# Patient Record
Sex: Female | Born: 1955
Health system: Southern US, Community
[De-identification: ages and names within clinical notes are randomized; demographics above are authoritative.]

## PROBLEM LIST (undated history)

## (undated) DIAGNOSIS — N200 Calculus of kidney: Secondary | ICD-10-CM

## (undated) DIAGNOSIS — H269 Unspecified cataract: Secondary | ICD-10-CM

## (undated) DIAGNOSIS — G4730 Sleep apnea, unspecified: Secondary | ICD-10-CM

## (undated) DIAGNOSIS — M1990 Unspecified osteoarthritis, unspecified site: Secondary | ICD-10-CM

## (undated) DIAGNOSIS — T7840XA Allergy, unspecified, initial encounter: Secondary | ICD-10-CM

## (undated) DIAGNOSIS — G4700 Insomnia, unspecified: Secondary | ICD-10-CM

## (undated) DIAGNOSIS — I8393 Asymptomatic varicose veins of bilateral lower extremities: Secondary | ICD-10-CM

## (undated) DIAGNOSIS — K219 Gastro-esophageal reflux disease without esophagitis: Secondary | ICD-10-CM

## (undated) HISTORY — DX: Unspecified cataract: H26.9

## (undated) HISTORY — PX: TONSILLECTOMY: SUR1361

## (undated) HISTORY — DX: Asymptomatic varicose veins of bilateral lower extremities: I83.93

## (undated) HISTORY — PX: CATARACT EXTRACTION: SUR2

## (undated) HISTORY — DX: Unspecified osteoarthritis, unspecified site: M19.90

## (undated) HISTORY — DX: Insomnia, unspecified: G47.00

## (undated) HISTORY — DX: Allergy, unspecified, initial encounter: T78.40XA

## (undated) HISTORY — DX: Gastro-esophageal reflux disease without esophagitis: K21.9

## (undated) HISTORY — PX: OTHER SURGICAL HISTORY: SHX169

## (undated) HISTORY — PX: POLYPECTOMY: SHX149

## (undated) HISTORY — DX: Sleep apnea, unspecified: G47.30

## (undated) HISTORY — DX: Calculus of kidney: N20.0

## (undated) HISTORY — PX: COLONOSCOPY: SHX174

## (undated) HISTORY — PX: KIDNEY STONE SURGERY: SHX686

---

## 2000-03-08 ENCOUNTER — Ambulatory Visit (HOSPITAL_COMMUNITY): Admission: RE | Admit: 2000-03-08 | Discharge: 2000-03-08 | Payer: Self-pay | Admitting: *Deleted

## 2000-03-08 ENCOUNTER — Encounter: Payer: Self-pay | Admitting: *Deleted

## 2000-08-07 ENCOUNTER — Other Ambulatory Visit: Admission: RE | Admit: 2000-08-07 | Discharge: 2000-08-07 | Payer: Self-pay | Admitting: Family Medicine

## 2001-11-18 ENCOUNTER — Other Ambulatory Visit: Admission: RE | Admit: 2001-11-18 | Discharge: 2001-12-07 | Payer: Self-pay

## 2003-09-14 ENCOUNTER — Other Ambulatory Visit: Admission: RE | Admit: 2003-09-14 | Discharge: 2003-09-14 | Payer: Self-pay | Admitting: Family Medicine

## 2004-04-22 ENCOUNTER — Ambulatory Visit: Payer: Self-pay | Admitting: Internal Medicine

## 2004-05-17 ENCOUNTER — Ambulatory Visit: Payer: Self-pay | Admitting: Family Medicine

## 2005-04-10 ENCOUNTER — Other Ambulatory Visit: Admission: RE | Admit: 2005-04-10 | Discharge: 2005-04-10 | Payer: Self-pay | Admitting: Family Medicine

## 2005-04-10 ENCOUNTER — Encounter: Payer: Self-pay | Admitting: Family Medicine

## 2005-04-10 ENCOUNTER — Ambulatory Visit: Payer: Self-pay | Admitting: Family Medicine

## 2005-04-19 ENCOUNTER — Ambulatory Visit: Payer: Self-pay | Admitting: Family Medicine

## 2005-09-29 ENCOUNTER — Ambulatory Visit: Payer: Self-pay | Admitting: Cardiology

## 2005-09-29 ENCOUNTER — Ambulatory Visit: Payer: Self-pay | Admitting: Family Medicine

## 2006-02-19 ENCOUNTER — Ambulatory Visit: Payer: Self-pay | Admitting: Family Medicine

## 2006-03-06 ENCOUNTER — Ambulatory Visit: Payer: Self-pay | Admitting: Family Medicine

## 2006-06-20 ENCOUNTER — Ambulatory Visit: Payer: Self-pay | Admitting: Family Medicine

## 2006-06-20 LAB — CONVERTED CEMR LAB
ALT: 18 units/L (ref 0–40)
AST: 21 units/L (ref 0–37)
Albumin: 4.1 g/dL (ref 3.5–5.2)
Alkaline Phosphatase: 52 units/L (ref 39–117)
BUN: 15 mg/dL (ref 6–23)
Basophils Absolute: 0.1 10*3/uL (ref 0.0–0.1)
Basophils Relative: 1 % (ref 0.0–1.0)
Bilirubin, Direct: 0.1 mg/dL (ref 0.0–0.3)
CO2: 31 meq/L (ref 19–32)
Calcium: 9.9 mg/dL (ref 8.4–10.5)
Chloride: 107 meq/L (ref 96–112)
Cholesterol: 221 mg/dL (ref 0–200)
Creatinine, Ser: 0.7 mg/dL (ref 0.4–1.2)
Direct LDL: 130 mg/dL
Eosinophils Absolute: 0.1 10*3/uL (ref 0.0–0.6)
Eosinophils Relative: 1.7 % (ref 0.0–5.0)
GFR calc Af Amer: 115 mL/min
GFR calc non Af Amer: 95 mL/min
Glucose, Bld: 81 mg/dL (ref 70–99)
HCT: 41.8 % (ref 36.0–46.0)
HDL: 66.6 mg/dL (ref >39.0)
Hemoglobin: 14.3 g/dL (ref 12.0–15.0)
Lymphocytes Relative: 34.7 % (ref 12.0–46.0)
MCHC: 34.2 g/dL (ref 30.0–36.0)
MCV: 90.4 fL (ref 78.0–100.0)
Monocytes Absolute: 0.5 10*3/uL (ref 0.2–0.7)
Monocytes Relative: 9.3 % (ref 3.0–11.0)
Neutro Abs: 2.8 10*3/uL (ref 1.4–7.7)
Neutrophils Relative %: 53.3 % (ref 43.0–77.0)
Platelets: 214 10*3/uL (ref 150–400)
Potassium: 4.3 meq/L (ref 3.5–5.1)
RBC: 4.62 M/uL (ref 3.87–5.11)
RDW: 12.3 % (ref 11.5–14.6)
Sodium: 145 meq/L (ref 135–145)
TSH: 0.89 microintl units/mL (ref 0.35–5.50)
Total Bilirubin: 0.7 mg/dL (ref 0.3–1.2)
Total CHOL/HDL Ratio: 3.3
Total Protein: 6.9 g/dL (ref 6.0–8.3)
Triglycerides: 70 mg/dL (ref 0–149)
VLDL: 14 mg/dL (ref 0–40)
WBC: 5.4 10*3/uL (ref 4.5–10.5)

## 2006-06-27 ENCOUNTER — Ambulatory Visit: Payer: Self-pay | Admitting: Family Medicine

## 2006-06-27 ENCOUNTER — Other Ambulatory Visit: Admission: RE | Admit: 2006-06-27 | Discharge: 2006-06-27 | Payer: Self-pay | Admitting: Family Medicine

## 2006-06-27 ENCOUNTER — Encounter: Payer: Self-pay | Admitting: Family Medicine

## 2007-01-28 ENCOUNTER — Ambulatory Visit: Payer: Self-pay | Admitting: Family Medicine

## 2007-01-28 DIAGNOSIS — J309 Allergic rhinitis, unspecified: Secondary | ICD-10-CM | POA: Insufficient documentation

## 2007-01-28 DIAGNOSIS — Z9189 Other specified personal risk factors, not elsewhere classified: Secondary | ICD-10-CM | POA: Insufficient documentation

## 2007-01-28 DIAGNOSIS — Z87410 Personal history of cervical dysplasia: Secondary | ICD-10-CM | POA: Insufficient documentation

## 2007-01-28 DIAGNOSIS — L989 Disorder of the skin and subcutaneous tissue, unspecified: Secondary | ICD-10-CM | POA: Insufficient documentation

## 2007-01-28 HISTORY — DX: Other specified personal risk factors, not elsewhere classified: Z91.89

## 2007-02-18 ENCOUNTER — Telehealth (INDEPENDENT_AMBULATORY_CARE_PROVIDER_SITE_OTHER): Payer: Self-pay | Admitting: *Deleted

## 2007-05-29 ENCOUNTER — Ambulatory Visit: Payer: Self-pay | Admitting: Family Medicine

## 2007-05-29 DIAGNOSIS — R1012 Left upper quadrant pain: Secondary | ICD-10-CM | POA: Insufficient documentation

## 2007-08-20 ENCOUNTER — Ambulatory Visit: Payer: Self-pay | Admitting: Family Medicine

## 2007-08-20 LAB — CONVERTED CEMR LAB
Bilirubin Urine: NEGATIVE
Blood in Urine, dipstick: NEGATIVE
Glucose, Urine, Semiquant: NEGATIVE
Ketones, urine, test strip: NEGATIVE
Nitrite: NEGATIVE
Protein, U semiquant: NEGATIVE
Specific Gravity, Urine: 1.025
Urobilinogen, UA: 0.2
pH: 6

## 2007-08-21 LAB — CONVERTED CEMR LAB
ALT: 19 units/L (ref 0–35)
AST: 22 units/L (ref 0–37)
Albumin: 4.1 g/dL (ref 3.5–5.2)
Alkaline Phosphatase: 60 units/L (ref 39–117)
BUN: 16 mg/dL (ref 6–23)
Basophils Absolute: 0 10*3/uL (ref 0.0–0.1)
Basophils Relative: 0.7 % (ref 0.0–1.0)
Bilirubin, Direct: 0.1 mg/dL (ref 0.0–0.3)
CO2: 32 meq/L (ref 19–32)
Calcium: 9.5 mg/dL (ref 8.4–10.5)
Chloride: 108 meq/L (ref 96–112)
Cholesterol: 233 mg/dL (ref 0–200)
Creatinine, Ser: 0.8 mg/dL (ref 0.4–1.2)
Direct LDL: 150.6 mg/dL
Eosinophils Absolute: 0.1 10*3/uL (ref 0.0–0.7)
Eosinophils Relative: 1.9 % (ref 0.0–5.0)
GFR calc Af Amer: 98 mL/min
GFR calc non Af Amer: 81 mL/min
Glucose, Bld: 102 mg/dL — ABNORMAL HIGH (ref 70–99)
HCT: 40.9 % (ref 36.0–46.0)
HDL: 67.6 mg/dL (ref >39.0)
Hemoglobin: 14 g/dL (ref 12.0–15.0)
Lymphocytes Relative: 35.1 % (ref 12.0–46.0)
MCHC: 34.3 g/dL (ref 30.0–36.0)
MCV: 90.1 fL (ref 78.0–100.0)
Monocytes Absolute: 0.4 10*3/uL (ref 0.1–1.0)
Monocytes Relative: 7.7 % (ref 3.0–12.0)
Neutro Abs: 2.8 10*3/uL (ref 1.4–7.7)
Neutrophils Relative %: 54.6 % (ref 43.0–77.0)
Platelets: 197 10*3/uL (ref 150–400)
Potassium: 4.2 meq/L (ref 3.5–5.1)
RBC: 4.54 M/uL (ref 3.87–5.11)
RDW: 12.1 % (ref 11.5–14.6)
Sodium: 140 meq/L (ref 135–145)
TSH: 0.72 microintl units/mL (ref 0.35–5.50)
Total Bilirubin: 0.6 mg/dL (ref 0.3–1.2)
Total CHOL/HDL Ratio: 3.4
Total Protein: 6.8 g/dL (ref 6.0–8.3)
Triglycerides: 60 mg/dL (ref 0–149)
VLDL: 12 mg/dL (ref 0–40)
WBC: 5.1 10*3/uL (ref 4.5–10.5)

## 2007-08-27 ENCOUNTER — Ambulatory Visit: Payer: Self-pay | Admitting: Family Medicine

## 2007-08-27 ENCOUNTER — Encounter: Payer: Self-pay | Admitting: Family Medicine

## 2007-08-27 ENCOUNTER — Other Ambulatory Visit: Admission: RE | Admit: 2007-08-27 | Discharge: 2007-08-27 | Payer: Self-pay | Admitting: Family Medicine

## 2007-08-27 DIAGNOSIS — K219 Gastro-esophageal reflux disease without esophagitis: Secondary | ICD-10-CM | POA: Insufficient documentation

## 2007-09-10 ENCOUNTER — Encounter: Payer: Self-pay | Admitting: Family Medicine

## 2007-09-24 ENCOUNTER — Ambulatory Visit: Payer: Self-pay | Admitting: Gastroenterology

## 2007-10-07 ENCOUNTER — Encounter: Payer: Self-pay | Admitting: Gastroenterology

## 2007-10-07 ENCOUNTER — Ambulatory Visit: Payer: Self-pay | Admitting: Gastroenterology

## 2007-10-07 ENCOUNTER — Encounter: Payer: Self-pay | Admitting: Family Medicine

## 2007-10-09 ENCOUNTER — Encounter: Payer: Self-pay | Admitting: Gastroenterology

## 2007-10-18 ENCOUNTER — Ambulatory Visit: Payer: Self-pay | Admitting: Family Medicine

## 2007-10-18 DIAGNOSIS — H8110 Benign paroxysmal vertigo, unspecified ear: Secondary | ICD-10-CM | POA: Insufficient documentation

## 2008-09-14 ENCOUNTER — Encounter: Payer: Self-pay | Admitting: Family Medicine

## 2008-12-14 ENCOUNTER — Ambulatory Visit: Payer: Self-pay | Admitting: Family Medicine

## 2008-12-14 DIAGNOSIS — M7020 Olecranon bursitis, unspecified elbow: Secondary | ICD-10-CM | POA: Insufficient documentation

## 2008-12-15 ENCOUNTER — Encounter: Admission: RE | Admit: 2008-12-15 | Discharge: 2008-12-15 | Payer: Self-pay | Admitting: Family Medicine

## 2008-12-22 ENCOUNTER — Ambulatory Visit: Payer: Self-pay | Admitting: Family Medicine

## 2009-09-24 ENCOUNTER — Encounter: Payer: Self-pay | Admitting: Family Medicine

## 2009-09-28 ENCOUNTER — Ambulatory Visit: Payer: Self-pay | Admitting: Family Medicine

## 2009-09-28 DIAGNOSIS — N390 Urinary tract infection, site not specified: Secondary | ICD-10-CM | POA: Insufficient documentation

## 2009-09-28 LAB — CONVERTED CEMR LAB
Bilirubin Urine: NEGATIVE
Glucose, Urine, Semiquant: NEGATIVE
Ketones, urine, test strip: NEGATIVE
Nitrite: POSITIVE
Protein, U semiquant: 100
Specific Gravity, Urine: 1.02
Urobilinogen, UA: 0.2
pH: 6

## 2009-09-30 ENCOUNTER — Ambulatory Visit: Payer: Self-pay | Admitting: Family Medicine

## 2009-09-30 ENCOUNTER — Telehealth: Payer: Self-pay | Admitting: Family Medicine

## 2009-09-30 DIAGNOSIS — M545 Low back pain, unspecified: Secondary | ICD-10-CM | POA: Insufficient documentation

## 2010-05-31 NOTE — Progress Notes (Signed)
Summary: needs directions  Phone Note Call from Patient Call back at Home Phone [phone]   Caller: Patient----live call Reason for Call: Talk to Nurse Summary of Call: was just seen today. Would like specific directions on how to take her prednisone? Initial call taken by: Warnell Forester,  September 30, 2009 12:59 PM  Follow-up for Phone Call        the pack has its own directions Follow-up by: Nelwyn Salisbury MD,  October 01, 2009 8:27 AM  Additional Follow-up for Phone Call Additional follow up Details #1::        Phone Call Completed Additional Follow-up by: Raechel Ache, RN,  October 01, 2009 9:15 AM

## 2010-05-31 NOTE — Assessment & Plan Note (Signed)
Summary: ? bladder inf//ccm   Vital Signs:  Patient profile:   55 year old female Weight:      180 pounds Temp:     98.1 degrees F oral BP sitting:   130 / 80  (left arm) Cuff size:   regular  Vitals Entered By: Raechel Ache, RN (Sep 28, 2009 8:41 AM) CC: C/o blood in urine Wed, feeling of pressure and bad odor to urine.   History of Present Illness: Here for 5 days of urinary burning and urgency, along with some occasioanl blood in the urine. No fever or vomiting. Drinking water and cranberry juice.   Allergies: 1)  ! Sulfa 2)  ! Penicillin 3)  ! Terramycin  Past History:  Past Medical History: Reviewed history from 08/27/2007 and no changes required. Allergic rhinitis GERD insomnia varicose leg veins  Past Surgical History: Reviewed history from 08/27/2007 and no changes required. Cervix lasered Tonsillectomy varicose veins per Dr. Donia Ast  Review of Systems  The patient denies anorexia, fever, weight loss, weight gain, vision loss, decreased hearing, hoarseness, chest pain, syncope, dyspnea on exertion, peripheral edema, prolonged cough, headaches, hemoptysis, abdominal pain, melena, hematochezia, severe indigestion/heartburn, hematuria, incontinence, genital sores, muscle weakness, suspicious skin lesions, transient blindness, difficulty walking, depression, unusual weight change, abnormal bleeding, enlarged lymph nodes, angioedema, breast masses, and testicular masses.    Physical Exam  General:  Well-developed,well-nourished,in no acute distress; alert,appropriate and cooperative throughout examination Abdomen:  Bowel sounds positive,abdomen soft and non-tender without masses, organomegaly or hernias noted.   Impression & Recommendations:  Problem # 1:  UTI (ICD-599.0)  Her updated medication list for this problem includes:    Cipro 500 Mg Tabs (Ciprofloxacin hcl) .Marland Kitchen..Marland Kitchen Two times a day  Orders: UA Dipstick w/o Micro (manual) (16109)  Complete  Medication List: 1)  Zyrtec Allergy 10 Mg Tabs (Cetirizine hcl) .Marland Kitchen.. 1 by mouth once daily 2)  Meclizine Hcl 25 Mg Tabs (Meclizine hcl) .Marland Kitchen.. 1 every 4 hours as needed dizziness 3)  Cipro 500 Mg Tabs (Ciprofloxacin hcl) .... Two times a day  Patient Instructions: 1)  Please schedule a follow-up appointment as needed .  Prescriptions: CIPRO 500 MG TABS (CIPROFLOXACIN HCL) two times a day  #14 x 0   Entered and Authorized by:   Nelwyn Salisbury MD   Signed by:   Nelwyn Salisbury MD on 09/28/2009   Method used:   Electronically to        [facility]  [id number]* (retail)       [address]       [place], [place]  [address]       Ph: [phone] or [phone]       Fax: [phone]   RxID:   [id number]   Laboratory Results   Urine Tests    Routine Urinalysis   Color: yellow Appearance: Cloudy Glucose: negative   (Normal Range: Negative) Bilirubin: negative   (Normal Range: Negative) Ketone: negative   (Normal Range: Negative) Spec. Gravity: 1.020   (Normal Range: 1.003-1.035) Blood: large   (Normal Range: Negative) pH: 6.0   (Normal Range: 5.0-8.0) Protein: 100   (Normal Range: Negative) Urobilinogen: 0.2   (Normal Range: 0-1) Nitrite: positive   (Normal Range: Negative) Leukocyte Esterace: large   (Normal Range: Negative)

## 2010-05-31 NOTE — Assessment & Plan Note (Signed)
Summary: back pain//ccm   Vital Signs:  Patient profile:   55 year old female Temp:     98.1 degrees F oral BP sitting:   132 / 92  (left arm) Cuff size:   regular  Vitals Entered By: Raechel Ache, RN (September 30, 2009 10:33 AM) CC: C/o LBP on right and R leg numb feeling, started yesterday.   History of Present Illness: Here with a friend who drove her for the sudden onset yesterday of spasms and severe pain in the right lower back. She has had occasional sciatica pains in this same spot since she was in her 55s, but usually it responds within 24 hours to Advil, heat, and stretches. She has never seen me about her back before. This time she has not improved with these simple measures, and the pain is worse than she has ever had before. The pain radiates down the right leg, and the leg tingles. No recent trauma, but she did spend 2 hours driving her car yesterday.   Allergies: 1)  ! Sulfa 2)  ! Penicillin 3)  ! Terramycin  Past History:  Past Medical History: Reviewed history from 08/27/2007 and no changes required. Allergic rhinitis GERD insomnia varicose leg veins  Past Surgical History: Reviewed history from 08/27/2007 and no changes required. Cervix lasered Tonsillectomy varicose veins per Dr. Donia Ast  Review of Systems  The patient denies anorexia, fever, weight loss, weight gain, vision loss, decreased hearing, hoarseness, chest pain, syncope, dyspnea on exertion, peripheral edema, prolonged cough, headaches, hemoptysis, abdominal pain, melena, hematochezia, severe indigestion/heartburn, hematuria, incontinence, genital sores, muscle weakness, suspicious skin lesions, transient blindness, difficulty walking, depression, unusual weight change, abnormal bleeding, enlarged lymph nodes, angioedema, breast masses, and testicular masses.    Physical Exam  General:  in pain, has trouble getting up on the exam table Msk:  very tender with considerable spasms in the right lower  back. Her ROM is quite reduced, and her SLR is positive.    Impression & Recommendations:  Problem # 1:  BACK PAIN, LUMBAR (ICD-724.2)  Her updated medication list for this problem includes:    Vicodin Hp 10-660 Mg Tabs (Hydrocodone-acetaminophen) .Marland Kitchen... 1 q 6 hours as needed pain    Flexeril 10 Mg Tabs (Cyclobenzaprine hcl) .Marland Kitchen... Three times a day as needed spasm  Orders: Promethazine up to 50mg  (J2550) Admin of Therapeutic Inj  intramuscular or subcutaneous (16109) Demerol  100mg   Injection (U0454)  Complete Medication List: 1)  Zyrtec Allergy 10 Mg Tabs (Cetirizine hcl) .Marland Kitchen.. 1 by mouth once daily 2)  Meclizine Hcl 25 Mg Tabs (Meclizine hcl) .Marland Kitchen.. 1 every 4 hours as needed dizziness 3)  Cipro 500 Mg Tabs (Ciprofloxacin hcl) .... Two times a day 4)  Vicodin Hp 10-660 Mg Tabs (Hydrocodone-acetaminophen) .Marland Kitchen.. 1 q 6 hours as needed pain 5)  Prednisone (pak) 10 Mg Tabs (Prednisone) .... As directed for 12 days 6)  Flexeril 10 Mg Tabs (Cyclobenzaprine hcl) .... Three times a day as needed spasm  Patient Instructions: 1)  Given a shot of demerol today for pain relief. Use Vicodin, Flexeril, and a steroid dose pack. If she is not much better by next week, she will follow up with me Prescriptions: FLEXERIL 10 MG TABS (CYCLOBENZAPRINE HCL) three times a day as needed spasm  #60 x 2   Entered and Authorized by:   Nelwyn Salisbury MD   Signed by:   Nelwyn Salisbury MD on 09/30/2009   Method used:   Print  then Give to Patient   RxID:   [id number] PREDNISONE (PAK) 10 MG TABS (PREDNISONE) as directed for 12 days  #1 x 0   Entered and Authorized by:   Nelwyn Salisbury MD   Signed by:   Nelwyn Salisbury MD on 09/30/2009   Method used:   Print then Give to Patient   RxID:   [id number] VICODIN HP 10-660 MG TABS (HYDROCODONE-ACETAMINOPHEN) 1 q 6 hours as needed pain  #60 x 0   Entered and Authorized by:   Nelwyn Salisbury MD   Signed by:   Nelwyn Salisbury MD on 09/30/2009   Method used:   Print then  Give to Patient   RxID:   [id number]    Medication Administration  Injection # 1:    Medication: Promethazine up to 50mg     Diagnosis: BACK PAIN, LUMBAR (ICD-724.2)    Route: IM    Site: LUOQ gluteus    Exp Date: 11/12    Lot #: [id number]    Mfr: [email or web address]    Comments: 25mg  given    Patient tolerated injection without complications    Given by: Raechel Ache, RN (September 30, 2009 11:46 AM)  Injection # 2:    Medication: Demerol  100mg   Injection    Diagnosis: BACK PAIN, LUMBAR (ICD-724.2)    Route: IM    Site: LUOQ gluteus    Exp Date: 06/30/2010    Lot #: [id number]    Mfr: [email or web address]    Comments: 50mg  given    Patient tolerated injection without complications    Given by: Raechel Ache, RN (September 30, 2009 11:46 AM)  Orders Added: 1)  Est. Patient Level IV [33295] 2)  Promethazine up to 50mg  [J2550] 3)  Admin of Therapeutic Inj  intramuscular or subcutaneous [96372] 4)  Demerol  100mg   Injection [J2175]

## 2010-09-16 NOTE — Assessment & Plan Note (Signed)
[facility] OFFICE NOTE   NAME:Tracey Sweeney, Tracey Sweeney                         MRN:          [id number]  DATE:06/27/2006                            DOB:          [birth year]    This is a 55 year old woman here for complete physical examination.  She  is doing well and has no complaints.  She does work-out several times a  week.  She recently had a normal mammogram.  She had been seeing the  [facility] for varicose veins in both legs, particularly in  the right leg.  She recently had the veins in the right leg ablated and  this was quite successful.  She no longer has any discomfort.  For other  details of her past medical history, family history, social history,  habits, etc., refer to her last physical note dated April 10, 2005.   ALLERGIES:  1. PENICILLIN.  2. MYCINS.  3. SULFA.   CURRENT MEDICATIONS:  1. Ambien 10 mg q.h.s. as needed.  2. Multivitamins daily.   OBJECTIVE:  VITAL SIGNS:  Height 5 feet 6 inches, weight 161, BP 132/90,  pulse 72 and regular.  GENERAL:  She appears healthy.  SKIN:  Clear.  HEENT:  Eyes clear, ears clear, pharynx clear.  NECK:  Supple without lymphadenopathy or masses.  LUNGS:  Clear.  CARDIAC:  Rate and rhythm regular without gallops, murmurs, rubs.  Distal pulses are full.  BREASTS AND AXILLA:  Clear.  ABDOMEN:  Soft, normal bowel sounds, nontender, no masses.  PELVIC:  External genitalia within normal limits.  Vagina is clear,  cervix is clear, Pap smear is obtained.  Uterus not enlarged.  Adnexa  without mass or tenderness.  EXTREMITIES:  No clubbing, cyanosis, or edema.  No evidence of  varicosities are seen on either leg.  NEUROLOGIC:  Grossly intact.   She was here for fasting labs on February 20.  There were within normal  limits except for her lipid panel.  LDL was high at 130 but this was  counter-balanced by an excellent HDL at 66.   ASSESSMENT AND  PLAN:  1. Complete physical.  She seems to be doing well.  I encouraged her      to continue exercise.  2. Insomnia.  I refilled Ambien 10 mg q.h.s. as needed, #30 with five      refills.  3. Acid reflux disease, stable off of medications.  4. Status post varicose vein surgery.  She seems to be doing well.     Tera Mater. Clent Ridges, MD  Electronically Signed    SAF/MedQ  DD: 06/28/2006  DT: 06/28/2006  Job #: [id number]

## 2010-10-04 ENCOUNTER — Encounter: Payer: Self-pay | Admitting: Family Medicine

## 2011-12-28 ENCOUNTER — Encounter: Payer: Self-pay | Admitting: Family Medicine

## 2012-01-03 ENCOUNTER — Ambulatory Visit (INDEPENDENT_AMBULATORY_CARE_PROVIDER_SITE_OTHER): Payer: BC Managed Care – PPO | Admitting: Family Medicine

## 2012-01-03 ENCOUNTER — Encounter: Payer: Self-pay | Admitting: Family Medicine

## 2012-01-03 VITALS — BP 130/80 | HR 77 | Temp 98.6°F | Wt 171.0 lb

## 2012-01-03 DIAGNOSIS — J329 Chronic sinusitis, unspecified: Secondary | ICD-10-CM

## 2012-01-03 MED ORDER — FLUTICASONE PROPIONATE 50 MCG/ACT NA SUSP: 2.0000 | Freq: Every day | NASAL | Status: DC

## 2012-01-03 MED ORDER — CEFUROXIME AXETIL 500 MG PO TABS: 500.0000 mg | ORAL_TABLET | Freq: Two times a day (BID) | ORAL | Status: AC

## 2012-01-03 NOTE — Progress Notes (Signed)
  Subjective:    Patient ID: Tracey Sweeney, female    DOB: Jul 06, 1955, 56 y.o.   MRN: [id number]  HPI Here with recurrent sinus and allergy problems. She went to [facility] one month ago, and she was given Flonase and a Zpack. This helped for awhile but the symptoms have returned. She has stuffy head, PND, HA, ST, and a dry cough. No fever.    Review of Systems  Constitutional: Negative.   HENT: Positive for congestion, postnasal drip and sinus pressure.   Eyes: Negative.   Respiratory: Positive for cough.        Objective:   Physical Exam  Constitutional: She appears well-developed and well-nourished.  HENT:  Right Ear: External ear normal.  Left Ear: External ear normal.  Nose: Nose normal.  Mouth/Throat: Oropharynx is clear and moist.  Eyes: Conjunctivae are normal.  Pulmonary/Chest: Effort normal and breath sounds normal.  Lymphadenopathy:    She has no cervical adenopathy.          Assessment & Plan:  This time use Ceftin and Flonase. Add Mucinex

## 2012-01-12 ENCOUNTER — Other Ambulatory Visit (INDEPENDENT_AMBULATORY_CARE_PROVIDER_SITE_OTHER): Payer: BC Managed Care – PPO

## 2012-01-12 DIAGNOSIS — Z0000 Encounter for general adult medical examination without abnormal findings: Secondary | ICD-10-CM

## 2012-01-12 LAB — POCT URINALYSIS DIPSTICK
Bilirubin, UA: NEGATIVE
Glucose, UA: NEGATIVE
Ketones, UA: NEGATIVE
Protein, UA: NEGATIVE
Spec Grav, UA: 1.015

## 2012-01-12 LAB — HEPATIC FUNCTION PANEL
Alkaline Phosphatase: 57 U/L (ref 39–117)
Bilirubin, Direct: 0 mg/dL (ref 0.0–0.3)
Total Bilirubin: 0.5 mg/dL (ref 0.3–1.2)
Total Protein: 6.8 g/dL (ref 6.0–8.3)

## 2012-01-12 LAB — LIPID PANEL
Cholesterol: 198 mg/dL (ref 0–200)
HDL: 55.5 mg/dL (ref >39.00)
LDL Cholesterol: 130 mg/dL — ABNORMAL HIGH (ref 0–99)
Triglycerides: 63 mg/dL (ref 0.0–149.0)
VLDL: 12.6 mg/dL (ref 0.0–40.0)

## 2012-01-12 LAB — CBC WITH DIFFERENTIAL/PLATELET
Basophils Relative: 1.1 % (ref 0.0–3.0)
Eosinophils Absolute: 0.1 10*3/uL (ref 0.0–0.7)
Eosinophils Relative: 1.3 % (ref 0.0–5.0)
Lymphocytes Relative: 42.4 % (ref 12.0–46.0)
MCHC: 33 g/dL (ref 30.0–36.0)
Monocytes Absolute: 0.4 10*3/uL (ref 0.1–1.0)
Neutrophils Relative %: 46.6 % (ref 43.0–77.0)
Platelets: 179 10*3/uL (ref 150.0–400.0)
RBC: 4.35 Mil/uL (ref 3.87–5.11)
WBC: 5.1 10*3/uL (ref 4.5–10.5)

## 2012-01-12 LAB — BASIC METABOLIC PANEL
BUN: 13 mg/dL (ref 6–23)
Calcium: 9.2 mg/dL (ref 8.4–10.5)
Creatinine, Ser: 0.7 mg/dL (ref 0.4–1.2)

## 2012-01-16 NOTE — Progress Notes (Signed)
Quick Note:  Called and spoke with pt and pt is aware. ______ 

## 2012-01-19 ENCOUNTER — Other Ambulatory Visit (HOSPITAL_COMMUNITY)
Admission: RE | Admit: 2012-01-19 | Discharge: 2012-01-19 | Disposition: A | Discharge status: Home / Self Care | Payer: BC Managed Care – PPO | Source: Ambulatory Visit | Attending: Family Medicine | Admitting: Family Medicine

## 2012-01-19 ENCOUNTER — Ambulatory Visit (INDEPENDENT_AMBULATORY_CARE_PROVIDER_SITE_OTHER): Payer: BC Managed Care – PPO | Admitting: Family Medicine

## 2012-01-19 ENCOUNTER — Encounter: Payer: Self-pay | Admitting: Family Medicine

## 2012-01-19 VITALS — BP 118/72 | HR 91 | Temp 98.7°F | Ht 67.0 in | Wt 171.0 lb

## 2012-01-19 DIAGNOSIS — Z01419 Encounter for gynecological examination (general) (routine) without abnormal findings: Secondary | ICD-10-CM | POA: Insufficient documentation

## 2012-01-19 DIAGNOSIS — Z0000 Encounter for general adult medical examination without abnormal findings: Secondary | ICD-10-CM

## 2012-01-19 DIAGNOSIS — Z23 Encounter for immunization: Secondary | ICD-10-CM

## 2012-01-19 NOTE — Progress Notes (Signed)
Subjective:    Patient ID: Tracey Sweeney, female    DOB: 12-17-1955, 56 y.o.   MRN: [id number]  HPI 56 yr old female for a cpx. She feels well in general. She has gotten over her sinus infection but she still has some sinus congestion. Using Zyrtec daily.    Review of Systems  Constitutional: Negative.  Negative for fever, diaphoresis, activity change, appetite change, fatigue and unexpected weight change.  HENT: Negative.  Negative for hearing loss, ear pain, nosebleeds, congestion, sore throat, trouble swallowing, neck pain, neck stiffness, voice change and tinnitus.   Eyes: Negative.  Negative for photophobia, pain, discharge, redness and visual disturbance.  Respiratory: Negative.  Negative for apnea, cough, choking, chest tightness, shortness of breath, wheezing and stridor.   Cardiovascular: Negative.  Negative for chest pain, palpitations and leg swelling.  Gastrointestinal: Negative.  Negative for nausea, vomiting, abdominal pain, diarrhea, constipation, blood in stool, abdominal distention and rectal pain.  Genitourinary: Negative.  Negative for dysuria, urgency, frequency, hematuria, flank pain, vaginal bleeding, vaginal discharge, enuresis, difficulty urinating, vaginal pain and menstrual problem.  Musculoskeletal: Negative.  Negative for myalgias, back pain, joint swelling, arthralgias and gait problem.  Skin: Negative.  Negative for color change, pallor, rash and wound.  Neurological: Negative.  Negative for dizziness, tremors, seizures, syncope, speech difficulty, weakness, light-headedness, numbness and headaches.  Hematological: Negative.  Negative for adenopathy. Does not bruise/bleed easily.  Psychiatric/Behavioral: Negative.  Negative for hallucinations, behavioral problems, confusion, disturbed wake/sleep cycle, dysphoric mood and agitation. The patient is not nervous/anxious.        Objective:   Physical Exam  Constitutional: She appears well-developed and well-nourished.  No distress.  HENT:  Head: Normocephalic and atraumatic.  Right Ear: External ear normal.  Left Ear: External ear normal.  Nose: Nose normal.  Mouth/Throat: Oropharynx is clear and moist. No oropharyngeal exudate.  Eyes: Conjunctivae normal and EOM are normal. Pupils are equal, round, and reactive to light. Right eye exhibits no discharge. Left eye exhibits no discharge. No scleral icterus.  Neck: Normal range of motion. Neck supple. No JVD present. No thyromegaly present.  Cardiovascular: Normal rate, regular rhythm, normal heart sounds and intact distal pulses.  Exam reveals no gallop and no friction rub.   No murmur heard.      EKG normal   Pulmonary/Chest: Effort normal and breath sounds normal. No stridor. No respiratory distress. She has no wheezes. She has no rales. She exhibits no tenderness.  Abdominal: Soft. Normal appearance and bowel sounds are normal. She exhibits no distension, no abdominal bruit, no ascites and no mass. There is no hepatosplenomegaly. There is no tenderness. There is no rigidity, no rebound and no guarding. No hernia.  Genitourinary: Rectum normal, vagina normal and uterus normal. No breast swelling, tenderness, discharge or bleeding. Cervix exhibits no motion tenderness, no discharge and no friability. Right adnexum displays no mass, no tenderness and no fullness. Left adnexum displays no mass, no tenderness and no fullness. No erythema, tenderness or bleeding around the vagina. No vaginal discharge found.  Musculoskeletal: Normal range of motion. She exhibits no edema and no tenderness.  Lymphadenopathy:    She has no cervical adenopathy.  Neurological: She is alert. She has normal reflexes. No cranial nerve deficit. She exhibits normal muscle tone. Coordination normal.  Skin: Skin is warm and dry. No rash noted. She is not diaphoretic. No erythema. No pallor.  Psychiatric: She has a normal mood and affect. Her behavior is normal. Judgment and thought content  normal.          Assessment & Plan:  Well exam. Add Mucinex to the Zyrtec.

## 2012-01-19 NOTE — Addendum Note (Signed)
Addended by: Aniceto Boss A on: 01/19/2012 10:14 AM   Modules accepted: Orders

## 2012-01-24 NOTE — Progress Notes (Signed)
Quick Note:  I left voice message with results. ______ 

## 2012-04-16 ENCOUNTER — Ambulatory Visit: Payer: BC Managed Care – PPO | Admitting: Family

## 2012-04-19 ENCOUNTER — Encounter: Payer: Self-pay | Admitting: Family Medicine

## 2012-04-19 ENCOUNTER — Ambulatory Visit (INDEPENDENT_AMBULATORY_CARE_PROVIDER_SITE_OTHER): Payer: BC Managed Care – PPO | Admitting: Family Medicine

## 2012-04-19 VITALS — BP 122/76 | HR 99 | Temp 98.4°F | Wt 173.0 lb

## 2012-04-19 DIAGNOSIS — M62838 Other muscle spasm: Secondary | ICD-10-CM

## 2012-04-19 DIAGNOSIS — R51 Headache: Secondary | ICD-10-CM

## 2012-04-19 MED ORDER — CYCLOBENZAPRINE HCL 5 MG PO TABS: 5.0000 mg | ORAL_TABLET | Freq: Three times a day (TID) | ORAL | Status: DC | PRN

## 2012-04-19 NOTE — Progress Notes (Signed)
Chief Complaint  Patient presents with  . Headache    HPI:  Acute visit for Headache: -symptoms: HA on and off for 4-5 days, had no headache this morning, but returned - felling better today -pain in lower back of her head and neck -has hx of a lot of headaches in the past and migraines in the past -started: Monday - after getting dove body wash in her L eye - ran water over it for a long time and tilted head in awkward way - got headache after this - eye got crusty and she saw eye doctor the next day and no damage and started on tobramycin just in case though no infection seen. Told headache could be caused by it. -denies: Fevers, chills, sinus issues, vision problems, dysphagia, weakness, NV -has tried: OTC medications  ROS: See pertinent positives and negatives per HPI.  Past Medical History  Diagnosis Date  . Allergy   . GERD (gastroesophageal reflux disease)   . Insomnia   . Varicose veins of legs     Family History  Problem Relation Age of Onset  . Cancer      skin, bladder, & breast  . Arthritis    . Diabetes    . Hyperlipidemia    . Hypertension    . Kidney disease      History   Social History  . Marital Status: Single    Spouse Name: N/A    Number of Children: N/A  . Years of Education: N/A   Social History Main Topics  . Smoking status: Never Smoker   . Smokeless tobacco: Never Used  . Alcohol Use: No  . Drug Use: No  . Sexually Active: None   Other Topics Concern  . None   Social History Narrative  . None    Current outpatient prescriptions:cetirizine (ZYRTEC) 10 MG tablet, Take 10 mg by mouth daily., Disp: , Rfl: ;  cyclobenzaprine (FLEXERIL) 5 MG tablet, Take 1 tablet (5 mg total) by mouth 3 (three) times daily as needed for muscle spasms., Disp: 5 tablet, Rfl: 0  EXAM:  Filed Vitals:   04/19/12 1432  BP: 122/76  Pulse: 99  Temp: 98.4 F (36.9 C)    There is no height on file to calculate BMI.  GENERAL: vitals reviewed and listed  above, alert, oriented, appears well hydrated and in no acute distress  HEENT: atraumatic, conjunttiva clear, PERRLA, no obvious abnormalities on inspection of external nose and ears  NECK: no obvious masses on inspection  LUNGS: clear to auscultation bilaterally, no wheezes, rales or rhonchi, good air movement  CV: HRRR, no peripheral edema  MS/NEURO: moves all extremities without noticeable abnormality, CNII-XII grossly intact, normal finger to nose, TTP post occipital muscles bilat  PSYCH: pleasant and cooperative, no obvious depression or anxiety  ASSESSMENT AND PLAN:  Discussed the following assessment and plan:  1. Muscle spasm  cyclobenzaprine (FLEXERIL) 5 MG tablet  2. Headache  cyclobenzaprine (FLEXERIL) 5 MG tablet   -likely muscle strain from awkward position while washing eye in shower - no alarm features, normal neuro exam, muscle TTP on exam, supportive tx and follow up if not resolving -Patient advised to return or notify a doctor immediately if symptoms worsen or persist or new concerns arise.  Patient Instructions  -use muscle relaxer as needed per instructions  -heat for 15 minutes 2x per day  -ibuprofen or tylenol if needed according to instructions  -topical sports creams if helpful  -follow up with your  doctor in 1-2 weeks if not resolved     Elijan Googe R.

## 2012-04-19 NOTE — Patient Instructions (Addendum)
-  use muscle relaxer as needed per instructions  -heat for 15 minutes 2x per day  -ibuprofen or tylenol if needed according to instructions  -topical sports creams if helpful  -follow up with your doctor in 1-2 weeks if not resolved

## 2012-05-01 HISTORY — PX: LITHOTRIPSY: SUR834

## 2012-07-25 ENCOUNTER — Encounter: Payer: Self-pay | Admitting: Internal Medicine

## 2012-07-25 ENCOUNTER — Ambulatory Visit (INDEPENDENT_AMBULATORY_CARE_PROVIDER_SITE_OTHER): Payer: BC Managed Care – PPO | Admitting: Internal Medicine

## 2012-07-25 VITALS — BP 140/80 | HR 84 | Temp 97.8°F | Wt 177.0 lb

## 2012-07-25 DIAGNOSIS — R300 Dysuria: Secondary | ICD-10-CM

## 2012-07-25 DIAGNOSIS — R319 Hematuria, unspecified: Secondary | ICD-10-CM

## 2012-07-25 LAB — POCT URINALYSIS DIPSTICK
Blood, UA: 3
Nitrite, UA: NEGATIVE
Spec Grav, UA: 1.025
Urobilinogen, UA: 0.2
pH, UA: 6.5

## 2012-07-25 MED ORDER — CIPROFLOXACIN HCL 250 MG PO TABS: 250.0000 mg | ORAL_TABLET | Freq: Two times a day (BID) | ORAL | Status: DC

## 2012-07-25 NOTE — Assessment & Plan Note (Signed)
57 year old white female complains of urinary frequency and dark color to her urine for last 24-48 hours. She has mild left sided low back pain. Her UA is positive for 3+ blood. She has history of bilateral kidney stones. Patient likely passing small stone. She has some urinary frequency and mild dysuria. Empiric treatment with Cipro 250 mg twice daily for 5 days. Increase fluid intake. Followup with her PCP in 2 weeks for repeat UA. If persistent hematuria, consider repeat CT of abdomen and pelvis.

## 2012-07-25 NOTE — Progress Notes (Signed)
  Subjective:    Patient ID: Tracey Sweeney, female    DOB: 03-Jan-1956, 57 y.o.   MRN: [id number]  HPI  57 year old white female with history of kidney stones complains of dark urine, urinary frequency and mild dysuria over last 1-2 days. Her symptoms started early in the morning when she got up 4 times to urinate. Later that morning she noticed dark color to her urine and possible blood in her urine. She also complains of mild left-sided low back pain. Severity is 2/10.   Review of Systems Negative for fever or chills  Past Medical History  Diagnosis Date  . Allergy   . GERD (gastroesophageal reflux disease)   . Insomnia   . Varicose veins of legs     History   Social History  . Marital Status: Single    Spouse Name: N/A    Number of Children: N/A  . Years of Education: N/A   Occupational History  . Not on file.   Social History Main Topics  . Smoking status: Never Smoker   . Smokeless tobacco: Never Used  . Alcohol Use: No  . Drug Use: No  . Sexually Active: Not on file   Other Topics Concern  . Not on file   Social History Narrative  . No narrative on file    Past Surgical History  Procedure Laterality Date  . Cervix laser surgery    . Tonsillectomy    . Varicose veins      Dr. Donia Ast  . Colonoscopy  10-10-07    per Dr. Wendall Papa, adenomatous polyp, repeat in 5 yrs     Family History  Problem Relation Age of Onset  . Cancer      skin, bladder, & breast  . Arthritis    . Diabetes    . Hyperlipidemia    . Hypertension    . Kidney disease      Allergies  Allergen Reactions  . Etodolac     Made stomach bleed  . Oxytetracycline     REACTION: syncope  . Penicillins     REACTION: Hives  . Sulfonamide Derivatives     REACTION: Nausea/vomiting    Current Outpatient Prescriptions on File Prior to Visit  Medication Sig Dispense Refill  . cetirizine (ZYRTEC) 10 MG tablet Take 10 mg by mouth daily.       No current facility-administered medications  on file prior to visit.    BP 140/80  Pulse 84  Temp(Src) 97.8 F (36.6 C) (Oral)  Wt 177 lb (80.287 kg)  BMI 27.72 kg/m2       Objective:   Physical Exam  Constitutional: She appears well-developed and well-nourished.  Cardiovascular: Normal rate, regular rhythm and normal heart sounds.   Pulmonary/Chest: Effort normal and breath sounds normal.  Abdominal: Soft. Bowel sounds are normal. She exhibits no mass. There is no rebound and no guarding.  Mild RLQ and LLQ tenderness.  Skin: Skin is warm and dry.          Assessment & Plan:

## 2012-07-25 NOTE — Patient Instructions (Signed)
Increase fluid intake for 2-3 days Follow up with Dr. Clent Ridges in 2 weeks Please contact our office if your symptoms do not improve or gets worse. Complete urinalysis before your office visit

## 2012-07-27 LAB — URINE CULTURE

## 2012-08-12 ENCOUNTER — Other Ambulatory Visit (INDEPENDENT_AMBULATORY_CARE_PROVIDER_SITE_OTHER): Payer: BC Managed Care – PPO

## 2012-08-12 DIAGNOSIS — R319 Hematuria, unspecified: Secondary | ICD-10-CM

## 2012-08-12 LAB — POCT URINALYSIS DIPSTICK
Glucose, UA: NEGATIVE
Ketones, UA: NEGATIVE
Leukocytes, UA: NEGATIVE
Protein, UA: NEGATIVE
Spec Grav, UA: 1.01
Urobilinogen, UA: 0.2

## 2012-09-02 ENCOUNTER — Telehealth: Payer: Self-pay | Admitting: Family Medicine

## 2012-09-02 NOTE — Telephone Encounter (Signed)
Pt wants results of UA done 08/12/12. Pt would like someone to call her.

## 2012-09-02 NOTE — Telephone Encounter (Signed)
Her sample had blood in it. She needs an OV with me soon to follow up

## 2012-09-02 NOTE — Telephone Encounter (Signed)
pls advise on labs

## 2012-09-03 NOTE — Telephone Encounter (Signed)
I spoke with pt and she will schedule the office visit. 

## 2012-09-04 ENCOUNTER — Encounter: Payer: Self-pay | Admitting: Family Medicine

## 2012-09-04 ENCOUNTER — Ambulatory Visit (INDEPENDENT_AMBULATORY_CARE_PROVIDER_SITE_OTHER): Payer: BC Managed Care – PPO | Admitting: Family Medicine

## 2012-09-04 VITALS — BP 126/80 | HR 124 | Temp 98.3°F | Wt 171.0 lb

## 2012-09-04 DIAGNOSIS — R3129 Other microscopic hematuria: Secondary | ICD-10-CM

## 2012-09-04 DIAGNOSIS — R319 Hematuria, unspecified: Secondary | ICD-10-CM

## 2012-09-04 LAB — POCT URINALYSIS DIPSTICK
Glucose, UA: NEGATIVE
Nitrite, UA: NEGATIVE
Spec Grav, UA: >=1.03
Urobilinogen, UA: 1

## 2012-09-04 NOTE — Addendum Note (Signed)
Addended by: Aniceto Boss A on: 09/04/2012 04:44 PM   Modules accepted: Orders

## 2012-09-04 NOTE — Progress Notes (Signed)
  Subjective:    Patient ID: Tracey Sweeney, female    DOB: November 03, 1955, 57 y.o.   MRN: [id number]  HPI Here to follow up hematuria. She saw Dr. Artist Pais on 07-25-12 for dark urine, and her sample showed blood with no bacteria. She was given empiric Cipro. She came back on 08-12-12 to leave a UA, and this again showed blood. Today her sample shows 3+ blood and trace leukocytes. She feels fine and has no symptoms at all.    Review of Systems  Constitutional: Negative.   Gastrointestinal: Negative.   Genitourinary: Negative.        Objective:   Physical Exam  Constitutional: She appears well-developed and well-nourished.  Abdominal: Soft. Bowel sounds are normal. She exhibits no distension and no mass. There is no tenderness. There is no rebound and no guarding.          Assessment & Plan:  She has persistent hematuria. We will culture the sample to rule out infection. If this is negative, we will refer her to Urology to evaluate.

## 2012-09-05 LAB — URINE CULTURE
Colony Count: NO GROWTH
Organism ID, Bacteria: NO GROWTH

## 2012-09-06 NOTE — Addendum Note (Signed)
Addended by: Gershon Crane A on: 09/06/2012 05:53 AM   Modules accepted: Orders

## 2012-09-06 NOTE — Progress Notes (Signed)
Quick Note:  Left a message for pt to return call. ______ 

## 2012-10-09 ENCOUNTER — Encounter: Payer: Self-pay | Admitting: Gastroenterology

## 2013-01-09 ENCOUNTER — Encounter: Payer: Self-pay | Admitting: Family Medicine

## 2013-02-13 ENCOUNTER — Encounter: Payer: Self-pay | Admitting: Gastroenterology

## 2013-03-31 DIAGNOSIS — N200 Calculus of kidney: Secondary | ICD-10-CM

## 2013-03-31 HISTORY — DX: Calculus of kidney: N20.0

## 2013-04-17 ENCOUNTER — Other Ambulatory Visit: Payer: Self-pay | Admitting: Urology

## 2013-04-17 ENCOUNTER — Encounter (HOSPITAL_COMMUNITY): Payer: Self-pay | Admitting: Pharmacy Technician

## 2013-04-21 ENCOUNTER — Encounter (HOSPITAL_COMMUNITY): Payer: Self-pay | Admitting: *Deleted

## 2013-04-27 MED ORDER — GENTAMICIN SULFATE 40 MG/ML IJ SOLN
340.0000 mg | INTRAVENOUS | Status: AC
  Administered 2013-04-28: 340 mg via INTRAVENOUS
  Filled 2013-04-27: qty 8.5

## 2013-04-28 ENCOUNTER — Encounter (HOSPITAL_COMMUNITY): Payer: Self-pay | Admitting: General Practice

## 2013-04-28 ENCOUNTER — Ambulatory Visit (HOSPITAL_COMMUNITY): Payer: BC Managed Care – PPO

## 2013-04-28 ENCOUNTER — Ambulatory Visit (HOSPITAL_COMMUNITY)
Admission: RE | Admit: 2013-04-28 | Discharge: 2013-04-28 | Disposition: A | Discharge status: Home / Self Care | Payer: BC Managed Care – PPO | Source: Ambulatory Visit | Attending: Urology | Admitting: Urology

## 2013-04-28 ENCOUNTER — Encounter (HOSPITAL_COMMUNITY)
Admission: RE | Disposition: A | Discharge status: Home / Self Care | Payer: Self-pay | Source: Ambulatory Visit | Attending: Urology

## 2013-04-28 DIAGNOSIS — N200 Calculus of kidney: Secondary | ICD-10-CM | POA: Insufficient documentation

## 2013-04-28 DIAGNOSIS — K219 Gastro-esophageal reflux disease without esophagitis: Secondary | ICD-10-CM | POA: Insufficient documentation

## 2013-04-28 DIAGNOSIS — R310 Gross hematuria: Secondary | ICD-10-CM | POA: Insufficient documentation

## 2013-04-28 DIAGNOSIS — N201 Calculus of ureter: Secondary | ICD-10-CM | POA: Insufficient documentation

## 2013-04-28 SURGERY — LITHOTRIPSY, ESWL
Anesthesia: LOCAL | Laterality: Left

## 2013-04-28 MED ORDER — DIPHENHYDRAMINE HCL 25 MG PO CAPS
25.0000 mg | ORAL_CAPSULE | ORAL | Status: AC
  Administered 2013-04-28: 25 mg via ORAL
  Filled 2013-04-28: qty 1

## 2013-04-28 MED ORDER — SODIUM CHLORIDE 0.9 % IV SOLN
INTRAVENOUS | Status: DC
  Administered 2013-04-28: 08:00:00 via INTRAVENOUS

## 2013-04-28 MED ORDER — SENNOSIDES-DOCUSATE SODIUM 8.6-50 MG PO TABS: 1.0000 | ORAL_TABLET | Freq: Two times a day (BID) | ORAL | Status: DC

## 2013-04-28 MED ORDER — DIAZEPAM 5 MG PO TABS
10.0000 mg | ORAL_TABLET | ORAL | Status: AC
  Administered 2013-04-28: 10 mg via ORAL
  Filled 2013-04-28: qty 2

## 2013-04-28 MED ORDER — OXYCODONE-ACETAMINOPHEN 5-325 MG PO TABS: 1.0000 | ORAL_TABLET | ORAL | Status: DC | PRN

## 2013-04-28 NOTE — H&P (Signed)
Tracey Sweeney is an 57 y.o. female.    Chief Complaint: Pre-Op  Left Shockwave Lithotripsy  HPI:   1 - Gross Hematuria - Pt with onset grossly bloody urine x1 06/2012 and resolved. Non-smoker. No dye/chemical/textile exposure. Prior eval 2007 with bilateral small renal stones. CT stone study 08/2012 with bilateral renal stones, and small right ureteral stone.   2 - Nephrolithiasis - 08/2012 - MET 3mm Rt UVJ stone, CaOxDi. CT at time with bilateral intrarenal stones, largest 6mm rt and lt lower mid, rest punctate. 03/2013 - Lt 6mm prox ureteral stone, 600HU, SSD 9cm, by CT stone, is visible on scout images.  PMH sig for TNA. No CV disease. No strong blood thinners.   Today Tracey Sweeney is seen to proceed with shockwave lithotripsy of her left proximal ureteral stone. No interval fevers or passage. Most recent UCX negative and most recent UA without infectious parameters.   Past Medical History  Diagnosis Date  . Allergy   . GERD (gastroesophageal reflux disease)   . Insomnia   . Varicose veins of legs     Past Surgical History  Procedure Laterality Date  . Cervix laser surgery    . Tonsillectomy    . Varicose veins      Dr. Donia Ast  . Colonoscopy  10-10-07    per Dr. Wendall Papa, adenomatous polyp, repeat in 5 yrs     Family History  Problem Relation Age of Onset  . Cancer      skin, bladder, & breast  . Arthritis    . Diabetes    . Hyperlipidemia    . Hypertension    . Kidney disease    . Bladder Cancer Father    Social History:  reports that she has never smoked. She has never used smokeless tobacco. She reports that she does not drink alcohol or use illicit drugs.  Allergies:  Allergies  Allergen Reactions  . Etodolac     Made stomach bleed  . Oxytetracycline     REACTION: syncope  . Penicillins     REACTION: Hives  . Sulfonamide Derivatives     REACTION: Nausea/vomiting    No prescriptions prior to admission    No results found for this or any previous visit (from  the past 48 hour(s)). No results found.  Review of Systems  Constitutional: Negative.  Negative for fever and chills.  HENT: Negative.   Eyes: Negative.   Respiratory: Negative.   Cardiovascular: Negative.   Gastrointestinal: Negative.   Genitourinary: Positive for flank pain.  Musculoskeletal: Negative.   Skin: Negative.   Neurological: Negative.   Endo/Heme/Allergies: Negative.   Psychiatric/Behavioral: Negative.     Height 5\' 7"  (1.702 m), weight 76.658 kg (169 lb). Physical Exam  Constitutional: She is oriented to person, place, and time. She appears well-developed and well-nourished.  HENT:  Head: Normocephalic and atraumatic.  Eyes: EOM are normal. Pupils are equal, round, and reactive to light.  Neck: Normal range of motion. Neck supple.  Respiratory: Effort normal.  GI: Soft. Bowel sounds are normal.  Genitourinary:  Very mild left CVAT  Musculoskeletal: Normal range of motion.  Neurological: She is alert and oriented to person, place, and time.  Skin: Skin is warm and dry.  Psychiatric: She has a normal mood and affect. Her behavior is normal. Judgment and thought content normal.     Assessment/Plan   1 - Gross Hematuria - Likely from stone disease. Low risk. Will consider contrast scan and cysto if persists.  2 - Nephrolithiasis / Lt Flank Pain  - Left proximal ureteral stone as per above. No interval progression with current medical therapy by recent KUB.  We rediscussed shockwave lithotripsy in detail as well as my "rule of 9s" with stones <65mm, less than 900 HU, and skin to stone distance <9cm having approximately 90% treatment success with single session of treatment. We then readdressed how stones that are larger, more dense, and in patients with less favorable anatomy have incrementally decreased success rates. We rediscussed risks including, bleeding, infection, hematoma, loss of kidney, need for staged therapy, need for adjunctive therapy and requirement to  refrain from any anticoagulants, anti-platelet or aspirin-like products peri-procedureally. After careful consideration, the patient has chosen to proceed today as planned.     [name] 04/28/2013, 6:08 AM

## 2013-04-29 ENCOUNTER — Other Ambulatory Visit: Payer: BC Managed Care – PPO | Admitting: Gastroenterology

## 2013-05-28 ENCOUNTER — Encounter: Payer: Self-pay | Admitting: Family Medicine

## 2013-05-28 ENCOUNTER — Ambulatory Visit (INDEPENDENT_AMBULATORY_CARE_PROVIDER_SITE_OTHER): Payer: BC Managed Care – PPO | Admitting: Family Medicine

## 2013-05-28 VITALS — BP 136/80 | HR 66 | Temp 97.6°F | Ht 67.0 in | Wt 175.0 lb

## 2013-05-28 DIAGNOSIS — J309 Allergic rhinitis, unspecified: Secondary | ICD-10-CM

## 2013-05-28 DIAGNOSIS — H8110 Benign paroxysmal vertigo, unspecified ear: Secondary | ICD-10-CM

## 2013-05-28 MED ORDER — MECLIZINE HCL 25 MG PO TABS: 25.0000 mg | ORAL_TABLET | ORAL | Status: DC | PRN

## 2013-05-28 NOTE — Progress Notes (Signed)
   Subjective:    Patient ID: Tracey Sweeney, female    DOB: 05/05/1955, 58 y.o.   MRN: [id number]  HPI Here for several days of sinus pressure, ear pressure , and vertigo. No pain or fever or ST or cough. She had some old meclizines at home which helped a little. She takes a Zyrtec daily.    Review of Systems  Constitutional: Negative.   HENT: Positive for congestion and sinus pressure. Negative for postnasal drip and rhinorrhea.   Eyes: Negative.   Respiratory: Negative.   Neurological: Positive for dizziness. Negative for light-headedness and headaches.       Objective:   Physical Exam  Constitutional: She is oriented to person, place, and time. She appears well-developed and well-nourished.  HENT:  Right Ear: External ear normal.  Left Ear: External ear normal.  Nose: Nose normal.  Mouth/Throat: Oropharynx is clear and moist.  Eyes: Conjunctivae are normal.  Pulmonary/Chest: Effort normal and breath sounds normal.  Lymphadenopathy:    She has no cervical adenopathy.  Neurological: She is alert and oriented to person, place, and time. No cranial nerve deficit.          Assessment & Plan:  Her vertigo is due to sinus congestion. Add Mucinex 1200 mg bid and saline nasal sprays.

## 2013-05-28 NOTE — Progress Notes (Signed)
Pre visit review using our clinic review tool, if applicable. No additional management support is needed unless otherwise documented below in the visit note. 

## 2013-07-18 ENCOUNTER — Encounter: Payer: Self-pay | Admitting: Gastroenterology

## 2013-07-23 ENCOUNTER — Encounter: Payer: Self-pay | Admitting: Gastroenterology

## 2013-08-20 ENCOUNTER — Ambulatory Visit (AMBULATORY_SURGERY_CENTER): Payer: Self-pay | Admitting: *Deleted

## 2013-08-20 VITALS — Ht 67.0 in | Wt 177.0 lb

## 2013-08-20 DIAGNOSIS — Z86010 Personal history of colonic polyps: Secondary | ICD-10-CM

## 2013-08-20 MED ORDER — MOVIPREP 100 G PO SOLR: 1.0000 | Freq: Once | ORAL | Status: DC

## 2013-08-20 NOTE — Progress Notes (Signed)
No egg or soy allergy. No anesthesia problems.  No home O2.  No diet meds.  

## 2013-08-22 ENCOUNTER — Encounter: Payer: Self-pay | Admitting: Gastroenterology

## 2013-09-03 ENCOUNTER — Ambulatory Visit (AMBULATORY_SURGERY_CENTER): Payer: BC Managed Care – PPO | Admitting: Gastroenterology

## 2013-09-03 ENCOUNTER — Encounter: Payer: Self-pay | Admitting: Gastroenterology

## 2013-09-03 VITALS — BP 125/72 | HR 68 | Temp 97.6°F | Resp 21 | Ht 67.0 in | Wt 177.0 lb

## 2013-09-03 DIAGNOSIS — D126 Benign neoplasm of colon, unspecified: Secondary | ICD-10-CM

## 2013-09-03 DIAGNOSIS — Z86010 Personal history of colonic polyps: Secondary | ICD-10-CM

## 2013-09-03 HISTORY — PX: COLONOSCOPY: SHX174

## 2013-09-03 MED ORDER — SODIUM CHLORIDE 0.9 % IV SOLN: 500.0000 mL | INTRAVENOUS | Status: DC

## 2013-09-03 NOTE — Op Note (Signed)
[facility]  [address] [place], [address]   COLONOSCOPY PROCEDURE REPORT  PATIENT: Tracey Sweeney, Tracey Sweeney  MR#: [id number] BIRTHDATE: 13-Feb-1956 , 61  yrs. old GENDER: Female ENDOSCOPIST: Milus Banister, MD PROCEDURE DATE:  09/03/2013 PROCEDURE:   Colonoscopy with snare polypectomy First Screening Colonoscopy - Avg.  risk and is 50 yrs.  old or older - No.  Prior Negative Screening - Now for repeat screening. N/A  History of Adenoma - Now for follow-up colonoscopy & has been > or = to 3 yrs.  Yes hx of adenoma.  Has been 3 or more years since last colonoscopy.  Polyps Removed Today? Yes. ASA CLASS:   Class II INDICATIONS:one adenomatous polyp removed 2009. MEDICATIONS: MAC sedation, administered by CRNA and propofol (Diprivan) 200mg  IV  DESCRIPTION OF PROCEDURE:   After the risks benefits and alternatives of the procedure were thoroughly explained, informed consent was obtained.  A digital rectal exam revealed no abnormalities of the rectum.   The LB [id number]  endoscope was introduced through the anus and advanced to the cecum, which was identified by both the appendix and ileocecal valve. No adverse events experienced.   The quality of the prep was excellent.  The instrument was then slowly withdrawn as the colon was fully examined.   COLON FINDINGS: Two polyps were found, removed and sent to pathology.  These were sessile, 2-57mm across, located in transverse and sigmoid segment, removed with cold snare.  The examination was otherwise normal.  Retroflexed views revealed no abnormalities. The time to cecum=2 minutes 52 seconds.  Withdrawal time=9 minutes 40 seconds.  The scope was withdrawn and the procedure completed. COMPLICATIONS: There were no complications.  ENDOSCOPIC IMPRESSION: Two polyps were found, removed and sent to pathology. The examination was otherwise normal.  RECOMMENDATIONS: If the polyp(s) removed today are proven to be  adenomatous (pre-cancerous) polyps, you will need a repeat colonoscopy in 5 years.  Otherwise you should continue to follow colorectal cancer screening guidelines for "routine risk" patients with colonoscopy in 10 years.  You will receive a letter within 1-2 weeks with the results of your biopsy as well as final recommendations.  Please call my office if you have not received a letter after 3 weeks.   eSigned:  Milus Banister, MD 09/03/2013 10:19 AM

## 2013-09-03 NOTE — Patient Instructions (Signed)

## 2013-09-03 NOTE — Progress Notes (Signed)
  [facility] Post-op Note  Patient: Tracey Sweeney  Procedure(s) Performed: colonoscopy  Patient Location: [facility] - Recovery Area  Anesthesia Type: Deep Sedation/Propofol  Level of Consciousness: awake, oriented and patient cooperative  Airway and Oxygen Therapy: Patient Spontanous Breathing  Post-op Pain: none  Post-op Assessment:  Post-op Vital signs reviewed, Patient's Cardiovascular Status Stable, Respiratory Function Stable, Patent Airway, No signs of Nausea or vomiting and Pain level controlled  Post-op Vital Signs: Reviewed and stable  Complications: No apparent anesthesia complications  Bryony Kaman E Burle Kwan 10:20 AM

## 2013-09-03 NOTE — Progress Notes (Signed)
Called to room to assist during endoscopic procedure.  Patient ID and intended procedure confirmed with present staff. Received instructions for my participation in the procedure from the performing physician.  

## 2013-09-04 ENCOUNTER — Telehealth: Payer: Self-pay | Admitting: *Deleted

## 2013-09-04 NOTE — Telephone Encounter (Signed)
Left message that we called for f/u 

## 2013-09-09 ENCOUNTER — Encounter: Payer: Self-pay | Admitting: Gastroenterology

## 2014-01-14 ENCOUNTER — Encounter: Payer: Self-pay | Admitting: Family Medicine

## 2014-02-11 ENCOUNTER — Other Ambulatory Visit (INDEPENDENT_AMBULATORY_CARE_PROVIDER_SITE_OTHER): Payer: BC Managed Care – PPO

## 2014-02-11 DIAGNOSIS — Z0000 Encounter for general adult medical examination without abnormal findings: Secondary | ICD-10-CM

## 2014-02-11 LAB — POCT URINALYSIS DIPSTICK
Bilirubin, UA: NEGATIVE
Blood, UA: NEGATIVE
GLUCOSE UA: NEGATIVE
Ketones, UA: NEGATIVE
Leukocytes, UA: NEGATIVE
NITRITE UA: NEGATIVE
PH UA: 6
SPEC GRAV UA: 1.02
UROBILINOGEN UA: 0.2

## 2014-02-11 LAB — BASIC METABOLIC PANEL
BUN: 14 mg/dL (ref 6–23)
CO2: 26 meq/L (ref 19–32)
Calcium: 9.6 mg/dL (ref 8.4–10.5)
Chloride: 108 mEq/L (ref 96–112)
Creatinine, Ser: 0.8 mg/dL (ref 0.4–1.2)
GFR: 79.86 mL/min (ref >60.00)
GLUCOSE: 77 mg/dL (ref 70–99)
POTASSIUM: 4 meq/L (ref 3.5–5.1)
Sodium: 141 mEq/L (ref 135–145)

## 2014-02-11 LAB — HEPATIC FUNCTION PANEL
ALBUMIN: 3.9 g/dL (ref 3.5–5.2)
ALK PHOS: 68 U/L (ref 39–117)
ALT: 15 U/L (ref 0–35)
AST: 17 U/L (ref 0–37)
Bilirubin, Direct: 0.1 mg/dL (ref 0.0–0.3)
Total Bilirubin: 0.7 mg/dL (ref 0.2–1.2)
Total Protein: 7.1 g/dL (ref 6.0–8.3)

## 2014-02-11 LAB — TSH: TSH: 1.07 u[IU]/mL (ref 0.35–4.50)

## 2014-02-11 LAB — LIPID PANEL
CHOL/HDL RATIO: 5
Cholesterol: 248 mg/dL — ABNORMAL HIGH (ref 0–200)
HDL: 52.7 mg/dL (ref >39.00)
LDL CALC: 179 mg/dL — AB (ref 0–99)
NonHDL: 195.3
Triglycerides: 82 mg/dL (ref 0.0–149.0)
VLDL: 16.4 mg/dL (ref 0.0–40.0)

## 2014-02-11 LAB — CBC WITH DIFFERENTIAL/PLATELET
BASOS PCT: 0.9 % (ref 0.0–3.0)
Basophils Absolute: 0 10*3/uL (ref 0.0–0.1)
Eosinophils Absolute: 0.1 10*3/uL (ref 0.0–0.7)
Eosinophils Relative: 1.3 % (ref 0.0–5.0)
HEMATOCRIT: 41.3 % (ref 36.0–46.0)
HEMOGLOBIN: 13.9 g/dL (ref 12.0–15.0)
Lymphocytes Relative: 36.2 % (ref 12.0–46.0)
Lymphs Abs: 1.9 10*3/uL (ref 0.7–4.0)
MCHC: 33.6 g/dL (ref 30.0–36.0)
MCV: 88.6 fl (ref 78.0–100.0)
MONOS PCT: 8.6 % (ref 3.0–12.0)
Monocytes Absolute: 0.4 10*3/uL (ref 0.1–1.0)
NEUTROS ABS: 2.7 10*3/uL (ref 1.4–7.7)
Neutrophils Relative %: 53 % (ref 43.0–77.0)
Platelets: 194 10*3/uL (ref 150.0–400.0)
RBC: 4.66 Mil/uL (ref 3.87–5.11)
RDW: 13.9 % (ref 11.5–15.5)
WBC: 5.2 10*3/uL (ref 4.0–10.5)

## 2014-02-18 ENCOUNTER — Encounter: Payer: Self-pay | Admitting: Family Medicine

## 2014-02-18 ENCOUNTER — Ambulatory Visit (INDEPENDENT_AMBULATORY_CARE_PROVIDER_SITE_OTHER): Payer: BC Managed Care – PPO | Admitting: Family Medicine

## 2014-02-18 VITALS — BP 134/80 | HR 103 | Temp 98.4°F | Ht 66.5 in | Wt 167.0 lb

## 2014-02-18 DIAGNOSIS — Z0000 Encounter for general adult medical examination without abnormal findings: Secondary | ICD-10-CM

## 2014-02-18 DIAGNOSIS — Z23 Encounter for immunization: Secondary | ICD-10-CM

## 2014-02-18 NOTE — Progress Notes (Signed)
Pre visit review using our clinic review tool, if applicable. No additional management support is needed unless otherwise documented below in the visit note. 

## 2014-02-18 NOTE — Addendum Note (Signed)
Addended by: Aggie Hacker A on: 02/18/2014 03:26 PM   Modules accepted: Orders

## 2014-02-18 NOTE — Progress Notes (Signed)
   Subjective:    Patient ID: Tracey Sweeney, female    DOB: [birth year], 58 y.o.   MRN: [id number]  HPI 58 yr old female for a cpx. She feels well. She admits to letting her diet slip the past 6 months and thus her cholesterol level has gone up.    Review of Systems  Constitutional: Negative.   HENT: Negative.   Eyes: Negative.   Respiratory: Negative.   Cardiovascular: Negative.   Gastrointestinal: Negative.   Genitourinary: Negative for dysuria, urgency, frequency, hematuria, flank pain, decreased urine volume, enuresis, difficulty urinating, pelvic pain and dyspareunia.  Musculoskeletal: Negative.   Skin: Negative.   Neurological: Negative.   Psychiatric/Behavioral: Negative.        Objective:   Physical Exam  Constitutional: She is oriented to person, place, and time. She appears well-developed and well-nourished. No distress.  HENT:  Head: Normocephalic and atraumatic.  Right Ear: External ear normal.  Left Ear: External ear normal.  Nose: Nose normal.  Mouth/Throat: Oropharynx is clear and moist. No oropharyngeal exudate.  Eyes: Conjunctivae and EOM are normal. Pupils are equal, round, and reactive to light. No scleral icterus.  Neck: Normal range of motion. Neck supple. No JVD present. No thyromegaly present.  Cardiovascular: Normal rate, regular rhythm, normal heart sounds and intact distal pulses.  Exam reveals no gallop and no friction rub.   No murmur heard. EKG normal   Pulmonary/Chest: Effort normal and breath sounds normal. No respiratory distress. She has no wheezes. She has no rales. She exhibits no tenderness.  Abdominal: Soft. Bowel sounds are normal. She exhibits no distension and no mass. There is no tenderness. There is no rebound and no guarding.  Musculoskeletal: Normal range of motion. She exhibits no edema and no tenderness.  Lymphadenopathy:    She has no cervical adenopathy.  Neurological: She is alert and oriented to person, place, and time. She has  normal reflexes. No cranial nerve deficit. She exhibits normal muscle tone. Coordination normal.  Skin: Skin is warm and dry. No rash noted. No erythema.  Psychiatric: She has a normal mood and affect. Her behavior is normal. Judgment and thought content normal.          Assessment & Plan:  Well exam.

## 2014-05-08 ENCOUNTER — Encounter: Payer: Self-pay | Admitting: Family Medicine

## 2014-05-08 ENCOUNTER — Ambulatory Visit (INDEPENDENT_AMBULATORY_CARE_PROVIDER_SITE_OTHER): Payer: BLUE CROSS/BLUE SHIELD | Admitting: Family Medicine

## 2014-05-08 VITALS — BP 127/67 | HR 87 | Temp 99.4°F | Ht 66.5 in | Wt 162.0 lb

## 2014-05-08 DIAGNOSIS — N3000 Acute cystitis without hematuria: Secondary | ICD-10-CM

## 2014-05-08 LAB — POCT URINALYSIS DIPSTICK
BILIRUBIN UA: NEGATIVE
Glucose, UA: NEGATIVE
Ketones, UA: NEGATIVE
NITRITE UA: NEGATIVE
Spec Grav, UA: <=1.005
Urobilinogen, UA: 0.2
pH, UA: 6.5

## 2014-05-08 MED ORDER — CIPROFLOXACIN HCL 500 MG PO TABS: 500.0000 mg | ORAL_TABLET | Freq: Two times a day (BID) | ORAL | Status: DC

## 2014-05-08 NOTE — Addendum Note (Signed)
Addended by: Aggie Hacker A on: 05/08/2014 01:00 PM   Modules accepted: Orders

## 2014-05-08 NOTE — Progress Notes (Signed)
   Subjective:    Patient ID: Tracey Sweeney, female    DOB: 07-29-1955, 59 y.o.   MRN: [id number]  HPI Here for 3 days of urinary urgency and frequency. No burning or back pain. Drinking lots of water.    Review of Systems  Constitutional: Negative.   Genitourinary: Positive for urgency and frequency. Negative for dysuria, hematuria, flank pain and pelvic pain.       Objective:   Physical Exam  Constitutional: She appears well-developed and well-nourished.  Abdominal: Soft. Bowel sounds are normal. She exhibits no distension and no mass. There is no tenderness. There is no rebound and no guarding.          Assessment & Plan:  Treat with Cipro. Culture the sample.

## 2014-05-08 NOTE — Progress Notes (Signed)
Pre visit review using our clinic review tool, if applicable. No additional management support is needed unless otherwise documented below in the visit note. 

## 2014-05-10 LAB — URINE CULTURE
COLONY COUNT: NO GROWTH
ORGANISM ID, BACTERIA: NO GROWTH

## 2014-05-12 ENCOUNTER — Telehealth: Payer: Self-pay | Admitting: Family Medicine

## 2014-05-12 MED ORDER — NITROFURANTOIN MONOHYD MACRO 100 MG PO CAPS: 100.0000 mg | ORAL_CAPSULE | Freq: Two times a day (BID) | ORAL | Status: DC

## 2014-05-12 NOTE — Telephone Encounter (Signed)
Stop the Cipro and call in Macrobid 100 mg bid for 7 days

## 2014-05-12 NOTE — Telephone Encounter (Signed)
Patient states she isn't feeling any better, still has the bladder infection.  She made a f/u appointment for 05/13/13 at 10:45 but would like to know if something else can be called in or if she still needs the appointment??  Please advise.   [id number] - [place], [place] - [address]

## 2014-05-12 NOTE — Telephone Encounter (Signed)
appt has been cancelled.

## 2014-05-12 NOTE — Telephone Encounter (Signed)
I sent script e-scribe and spoke with pt. She also requested that we cancel her appointment with Dr. Sarajane Jews on 1/13/1, she will follow up as needed.

## 2014-05-13 ENCOUNTER — Ambulatory Visit: Payer: BLUE CROSS/BLUE SHIELD | Admitting: Family Medicine

## 2014-08-10 ENCOUNTER — Ambulatory Visit (INDEPENDENT_AMBULATORY_CARE_PROVIDER_SITE_OTHER): Payer: BLUE CROSS/BLUE SHIELD | Admitting: Family Medicine

## 2014-08-10 ENCOUNTER — Encounter: Payer: Self-pay | Admitting: Family Medicine

## 2014-08-10 VITALS — BP 126/79 | HR 122 | Temp 99.0°F | Ht 66.5 in | Wt 167.0 lb

## 2014-08-10 DIAGNOSIS — R319 Hematuria, unspecified: Secondary | ICD-10-CM | POA: Diagnosis not present

## 2014-08-10 DIAGNOSIS — M940 Chondrocostal junction syndrome [Tietze]: Secondary | ICD-10-CM | POA: Diagnosis not present

## 2014-08-10 LAB — POCT URINALYSIS DIPSTICK
Glucose, UA: NEGATIVE
Nitrite, UA: NEGATIVE
Spec Grav, UA: 1.02
Urobilinogen, UA: 0.2
pH, UA: 5.5

## 2014-08-10 NOTE — Progress Notes (Signed)
   Subjective:    Patient ID: Tracey Sweeney, female    DOB: [birth year], 59 y.o.   MRN: [id number]  HPI Here for interemittent chest and middle back pains that started last week. Today she actually feels fine. The pains were sharp but mild. No SOB. They were not related to exertion. She did nothing for them. Also she saw some blood in the urine over the weekend but she denies any pressure or burning. She has set up an appt with Dr. Estill Dooms in [facility] for next week to get a second Urology opinion.    Review of Systems  Constitutional: Negative.   Respiratory: Negative.   Cardiovascular: Positive for chest pain. Negative for palpitations and leg swelling.  Genitourinary: Positive for hematuria. Negative for dysuria, urgency and pelvic pain.       Objective:   Physical Exam  Constitutional: She appears well-developed and well-nourished. No distress.  Cardiovascular: Normal rate, regular rhythm, normal heart sounds and intact distal pulses.   Pulmonary/Chest: Effort normal and breath sounds normal.  Tender along the lower left sternal margin   Abdominal: Soft. Bowel sounds are normal. She exhibits no distension. There is no tenderness. There is no rebound and no guarding.          Assessment & Plan:  She has costochondritis and it seems to be resolving. She will observe and take Advil prn. We will culture her urine sample today.

## 2014-08-10 NOTE — Progress Notes (Signed)
Pre visit review using our clinic review tool, if applicable. No additional management support is needed unless otherwise documented below in the visit note. 

## 2014-08-12 LAB — URINE CULTURE
Colony Count: NO GROWTH
ORGANISM ID, BACTERIA: NO GROWTH

## 2014-12-10 ENCOUNTER — Encounter: Payer: Self-pay | Admitting: Gastroenterology

## 2015-01-12 LAB — HM MAMMOGRAPHY

## 2015-01-15 ENCOUNTER — Encounter: Payer: Self-pay | Admitting: Family Medicine

## 2015-03-01 ENCOUNTER — Other Ambulatory Visit (INDEPENDENT_AMBULATORY_CARE_PROVIDER_SITE_OTHER): Payer: BLUE CROSS/BLUE SHIELD

## 2015-03-01 DIAGNOSIS — Z0000 Encounter for general adult medical examination without abnormal findings: Secondary | ICD-10-CM

## 2015-03-01 LAB — BASIC METABOLIC PANEL
BUN: 9 mg/dL (ref 6–23)
CO2: 25 mEq/L (ref 19–32)
Calcium: 9.7 mg/dL (ref 8.4–10.5)
Chloride: 105 mEq/L (ref 96–112)
Creatinine, Ser: 0.73 mg/dL (ref 0.40–1.20)
GFR: 87.16 mL/min (ref >60.00)
GLUCOSE: 91 mg/dL (ref 70–99)
POTASSIUM: 4 meq/L (ref 3.5–5.1)
SODIUM: 140 meq/L (ref 135–145)

## 2015-03-01 LAB — HEPATIC FUNCTION PANEL
ALBUMIN: 4.2 g/dL (ref 3.5–5.2)
ALT: 12 U/L (ref 0–35)
AST: 18 U/L (ref 0–37)
Alkaline Phosphatase: 75 U/L (ref 39–117)
Bilirubin, Direct: 0.1 mg/dL (ref 0.0–0.3)
Total Bilirubin: 0.5 mg/dL (ref 0.2–1.2)
Total Protein: 6.9 g/dL (ref 6.0–8.3)

## 2015-03-01 LAB — CBC WITH DIFFERENTIAL/PLATELET
Basophils Absolute: 0 10*3/uL (ref 0.0–0.1)
Basophils Relative: 0.9 % (ref 0.0–3.0)
EOS PCT: 1.4 % (ref 0.0–5.0)
Eosinophils Absolute: 0.1 10*3/uL (ref 0.0–0.7)
HCT: 41.7 % (ref 36.0–46.0)
HEMOGLOBIN: 13.9 g/dL (ref 12.0–15.0)
Lymphocytes Relative: 35 % (ref 12.0–46.0)
Lymphs Abs: 1.9 10*3/uL (ref 0.7–4.0)
MCHC: 33.5 g/dL (ref 30.0–36.0)
MCV: 89.1 fl (ref 78.0–100.0)
MONOS PCT: 7.8 % (ref 3.0–12.0)
Monocytes Absolute: 0.4 10*3/uL (ref 0.1–1.0)
Neutro Abs: 2.9 10*3/uL (ref 1.4–7.7)
Neutrophils Relative %: 54.9 % (ref 43.0–77.0)
Platelets: 201 10*3/uL (ref 150.0–400.0)
RBC: 4.68 Mil/uL (ref 3.87–5.11)
RDW: 13 % (ref 11.5–15.5)
WBC: 5.4 10*3/uL (ref 4.0–10.5)

## 2015-03-01 LAB — LIPID PANEL
CHOLESTEROL: 196 mg/dL (ref 0–200)
HDL: 60.1 mg/dL (ref >39.00)
LDL Cholesterol: 122 mg/dL — ABNORMAL HIGH (ref 0–99)
NonHDL: 136.16
Total CHOL/HDL Ratio: 3
Triglycerides: 72 mg/dL (ref 0.0–149.0)
VLDL: 14.4 mg/dL (ref 0.0–40.0)

## 2015-03-01 LAB — POCT URINALYSIS DIPSTICK
Bilirubin, UA: NEGATIVE
Glucose, UA: NEGATIVE
Ketones, UA: NEGATIVE
Nitrite, UA: NEGATIVE
SPEC GRAV UA: 1.025
UROBILINOGEN UA: 0.2
pH, UA: 6

## 2015-03-01 LAB — TSH: TSH: 1.16 u[IU]/mL (ref 0.35–4.50)

## 2015-03-08 ENCOUNTER — Encounter: Payer: Self-pay | Admitting: Family Medicine

## 2015-03-08 ENCOUNTER — Ambulatory Visit (INDEPENDENT_AMBULATORY_CARE_PROVIDER_SITE_OTHER): Payer: BLUE CROSS/BLUE SHIELD | Admitting: Family Medicine

## 2015-03-08 VITALS — BP 111/79 | HR 113 | Temp 98.5°F | Ht 66.5 in | Wt 162.0 lb

## 2015-03-08 DIAGNOSIS — Z0000 Encounter for general adult medical examination without abnormal findings: Secondary | ICD-10-CM | POA: Diagnosis not present

## 2015-03-08 DIAGNOSIS — Z23 Encounter for immunization: Secondary | ICD-10-CM

## 2015-03-08 DIAGNOSIS — L989 Disorder of the skin and subcutaneous tissue, unspecified: Secondary | ICD-10-CM | POA: Diagnosis not present

## 2015-03-08 DIAGNOSIS — Z209 Contact with and (suspected) exposure to unspecified communicable disease: Secondary | ICD-10-CM | POA: Diagnosis not present

## 2015-03-08 NOTE — Progress Notes (Signed)
   Subjective:    Patient ID: Tracey Sweeney, female    DOB: [birth year], 59 y.o.   MRN: [id number]  HPI 59 yr old female for a cpx. She feels well but asks me to check a lesion on her chest. This has been present for several years and is not changing in size, however it is changing in pigmentation. It was once a solid dark brown and it has slowly changed to more of a pink color with only a spot of brown remaining.    Review of Systems  Constitutional: Negative.   HENT: Negative.   Eyes: Negative.   Respiratory: Negative.   Cardiovascular: Negative.   Gastrointestinal: Negative.   Genitourinary: Negative for dysuria, urgency, frequency, hematuria, flank pain, decreased urine volume, enuresis, difficulty urinating, pelvic pain and dyspareunia.  Musculoskeletal: Negative.   Skin: Negative.   Neurological: Negative.   Psychiatric/Behavioral: Negative.        Objective:   Physical Exam  Constitutional: She is oriented to person, place, and time. She appears well-developed and well-nourished. No distress.  HENT:  Head: Normocephalic and atraumatic.  Right Ear: External ear normal.  Left Ear: External ear normal.  Nose: Nose normal.  Mouth/Throat: Oropharynx is clear and moist. No oropharyngeal exudate.  Eyes: Conjunctivae and EOM are normal. Pupils are equal, round, and reactive to light. No scleral icterus.  Neck: Normal range of motion. Neck supple. No JVD present. No thyromegaly present.  Cardiovascular: Normal rate, regular rhythm, normal heart sounds and intact distal pulses.  Exam reveals no gallop and no friction rub.   No murmur heard. EKG shows stable LAFB  Pulmonary/Chest: Effort normal and breath sounds normal. No respiratory distress. She has no wheezes. She has no rales. She exhibits no tenderness.  Abdominal: Soft. Bowel sounds are normal. She exhibits no distension and no mass. There is no tenderness. There is no rebound and no guarding.  Musculoskeletal: Normal range of  motion. She exhibits no edema or tenderness.  Lymphadenopathy:    She has no cervical adenopathy.  Neurological: She is alert and oriented to person, place, and time. She has normal reflexes. No cranial nerve deficit. She exhibits normal muscle tone. Coordination normal.  Skin: Skin is warm and dry. No rash noted. No erythema.  Psychiatric: She has a normal mood and affect. Her behavior is normal. Judgment and thought content normal.          Assessment & Plan:  Well exam. We discussed diet and exercise. We will refer heer to Dermatology for the skin lesion.

## 2015-03-08 NOTE — Progress Notes (Signed)
Pre visit review using our clinic review tool, if applicable. No additional management support is needed unless otherwise documented below in the visit note. 

## 2015-03-09 LAB — HEPATITIS C ANTIBODY: HCV Ab: NEGATIVE

## 2015-11-15 ENCOUNTER — Encounter: Payer: Self-pay | Admitting: Family Medicine

## 2015-11-15 ENCOUNTER — Ambulatory Visit (INDEPENDENT_AMBULATORY_CARE_PROVIDER_SITE_OTHER): Payer: BLUE CROSS/BLUE SHIELD | Admitting: Family Medicine

## 2015-11-15 VITALS — BP 120/80 | HR 83 | Temp 98.3°F | Ht 66.5 in | Wt 161.7 lb

## 2015-11-15 DIAGNOSIS — T148 Other injury of unspecified body region: Secondary | ICD-10-CM | POA: Diagnosis not present

## 2015-11-15 DIAGNOSIS — W57XXXA Bitten or stung by nonvenomous insect and other nonvenomous arthropods, initial encounter: Secondary | ICD-10-CM | POA: Diagnosis not present

## 2015-11-15 NOTE — Progress Notes (Signed)
HPI:  Tracey Sweeney is a pleasant 60 yo here for an acute visit for a tick bite. She was bitten 2 weeks ago. She felt the tick bite and removed it before it attached. She is not sure what type of tick it was and can not describe it well. This was in the L groin. Still with a small papule. Had a very mild HA a few times,with a little nausea in the last few weeks - has had this in the past. She reports this has been very mild and denies fevers, vomiting, persistent HA or nausea, rash, malaise, joint pains, myalgias, sinus pain, ear pain, SOB.   ROS: See pertinent positives and negatives per HPI.  Past Medical History  Diagnosis Date  . Allergy   . GERD (gastroesophageal reflux disease)   . Insomnia   . Varicose veins of legs   . Kidney stones dec 2014    Past Surgical History  Procedure Laterality Date  . Cervix laser surgery    . Tonsillectomy    . Varicose veins      Dr. Eilleen Kempf  . Colonoscopy  09-03-13    per Dr. Oretha Caprice, adenomatous polyps, repeat in 5 yrs   . Lithotripsy  2014    Family History  Problem Relation Age of Onset  . Cancer      skin, bladder, & breast  . Arthritis    . Diabetes    . Hyperlipidemia    . Hypertension    . Kidney disease    . Bladder Cancer Father   . Melanoma Father   . Colon cancer Neg Hx   . Melanoma Paternal Aunt     Social History   Social History  . Marital Status: Single    Spouse Name: N/A  . Number of Children: N/A  . Years of Education: N/A   Social History Main Topics  . Smoking status: Never Smoker   . Smokeless tobacco: Never Used  . Alcohol Use: No  . Drug Use: No  . Sexual Activity: Not Asked   Other Topics Concern  . None   Social History Narrative     Current outpatient prescriptions:  .  cetirizine (ZYRTEC) 10 MG tablet, Take 10 mg by mouth daily., Disp: , Rfl:   EXAM:  Filed Vitals:   11/15/15 1614  BP: 120/80  Pulse: 83  Temp: 98.3 F (36.8 C)    Body mass index is 25.71 kg/(m^2).  GENERAL:  vitals reviewed and listed above, alert, oriented, appears well hydrated and in no acute distress  HEENT: atraumatic, conjunttiva clear, no obvious abnormalities on inspection of external nose and ears, normal appearance of ear canals and TMs, clear nasal congestion, mild post oropharyngeal erythema with PND, no tonsillar edema or exudate, no sinus TTP  NECK: no obvious masses on inspection  LUNGS: clear to auscultation bilaterally, no wheezes, rales or rhonchi, good air movement  CV: HRRR, no peripheral edema  MS: moves all extremities without noticeable abnormality  SKIN: extremely small healed papule L groin region  PSYCH: pleasant and cooperative, no obvious depression   ASSESSMENT AND PLAN:  Discussed the following assessment and plan:  Tick bite  -the tick bite has healed well -we talked about signs and symptoms, dx and prophylaxis for tick born illness -while she has had a few mild HAs, that seem more of baseline for her then something out of the ordinary or her feeling sick - did advise if this worsens, recrrrent, feels sick, fevers,  rashes or any other signs or symptoms of tick borne illness to follow up promptly. She agreed. -Patient advised to return or notify a doctor immediately if symptoms worsen or persist or new concerns arise.  Patient Instructions  Follow up immediately if any worsening of symptoms or new symptoms, fevers, rash or other complaints.      Colin Benton R., DO

## 2015-11-15 NOTE — Patient Instructions (Signed)
Follow up immediately if any worsening of symptoms or new symptoms, fevers, rash or other complaints.

## 2015-11-15 NOTE — Progress Notes (Signed)
Pre visit review using our clinic review tool, if applicable. No additional management support is needed unless otherwise documented below in the visit note. 

## 2015-12-22 ENCOUNTER — Encounter: Payer: Self-pay | Admitting: Family Medicine

## 2015-12-22 ENCOUNTER — Ambulatory Visit (INDEPENDENT_AMBULATORY_CARE_PROVIDER_SITE_OTHER): Payer: BLUE CROSS/BLUE SHIELD | Admitting: Family Medicine

## 2015-12-22 VITALS — BP 108/78 | HR 84 | Temp 98.7°F | Ht 66.5 in | Wt 157.0 lb

## 2015-12-22 DIAGNOSIS — L723 Sebaceous cyst: Secondary | ICD-10-CM | POA: Diagnosis not present

## 2015-12-22 NOTE — Progress Notes (Signed)
   Subjective:    Patient ID: Tracey Sweeney, female    DOB: 1955-11-03, 60 y.o.   MRN: [id number]  HPI Here to check a sore lump on her chest that she noticed 10 days ago. At first it was a little larger and she had an area of redness about 3 cm in diameter on the left chest. Since then it has gotten smaller and the redness has resolved. No other symptoms.    Review of Systems  Constitutional: Negative.   Respiratory: Negative.   Cardiovascular: Negative.   Skin: Positive for color change.       Objective:   Physical Exam  Constitutional: She appears well-developed and well-nourished.  Skin:  There is a small mobile slightly tender lump under the skin of the left chest. No erythema or warmth.           Assessment & Plan:  Inflamed sebaceous cyst. Use warm compresses. Recheck prn.  Laurey Morale, MD

## 2015-12-22 NOTE — Progress Notes (Signed)
Pre visit review using our clinic review tool, if applicable. No additional management support is needed unless otherwise documented below in the visit note. 

## 2016-01-14 ENCOUNTER — Encounter: Payer: Self-pay | Admitting: Family Medicine

## 2016-01-14 DIAGNOSIS — Z1231 Encounter for screening mammogram for malignant neoplasm of breast: Secondary | ICD-10-CM | POA: Diagnosis not present

## 2016-01-14 DIAGNOSIS — Z803 Family history of malignant neoplasm of breast: Secondary | ICD-10-CM | POA: Diagnosis not present

## 2016-02-23 DIAGNOSIS — H2513 Age-related nuclear cataract, bilateral: Secondary | ICD-10-CM | POA: Diagnosis not present

## 2016-03-01 ENCOUNTER — Other Ambulatory Visit (INDEPENDENT_AMBULATORY_CARE_PROVIDER_SITE_OTHER): Payer: BLUE CROSS/BLUE SHIELD

## 2016-03-01 DIAGNOSIS — Z0000 Encounter for general adult medical examination without abnormal findings: Secondary | ICD-10-CM

## 2016-03-01 LAB — HEPATIC FUNCTION PANEL
ALBUMIN: 4.4 g/dL (ref 3.5–5.2)
ALK PHOS: 78 U/L (ref 39–117)
ALT: 14 U/L (ref 0–35)
AST: 19 U/L (ref 0–37)
Bilirubin, Direct: 0.1 mg/dL (ref 0.0–0.3)
TOTAL PROTEIN: 6.5 g/dL (ref 6.0–8.3)
Total Bilirubin: 0.5 mg/dL (ref 0.2–1.2)

## 2016-03-01 LAB — POC URINALSYSI DIPSTICK (AUTOMATED)
BILIRUBIN UA: NEGATIVE
Glucose, UA: NEGATIVE
KETONES UA: NEGATIVE
Nitrite, UA: NEGATIVE
PH UA: 5.5
Spec Grav, UA: 1.025
Urobilinogen, UA: 0.2

## 2016-03-01 LAB — LIPID PANEL
CHOL/HDL RATIO: 3
CHOLESTEROL: 206 mg/dL — AB (ref 0–200)
HDL: 59.3 mg/dL (ref >39.00)
LDL CALC: 128 mg/dL — AB (ref 0–99)
NONHDL: 146.66
Triglycerides: 92 mg/dL (ref 0.0–149.0)
VLDL: 18.4 mg/dL (ref 0.0–40.0)

## 2016-03-01 LAB — CBC WITH DIFFERENTIAL/PLATELET
BASOS ABS: 0 10*3/uL (ref 0.0–0.1)
Basophils Relative: 0.7 % (ref 0.0–3.0)
EOS PCT: 1.4 % (ref 0.0–5.0)
Eosinophils Absolute: 0.1 10*3/uL (ref 0.0–0.7)
HEMATOCRIT: 39.6 % (ref 36.0–46.0)
HEMOGLOBIN: 13.5 g/dL (ref 12.0–15.0)
LYMPHS ABS: 1.7 10*3/uL (ref 0.7–4.0)
LYMPHS PCT: 33.9 % (ref 12.0–46.0)
MCHC: 34 g/dL (ref 30.0–36.0)
MCV: 87.6 fl (ref 78.0–100.0)
MONOS PCT: 8.5 % (ref 3.0–12.0)
Monocytes Absolute: 0.4 10*3/uL (ref 0.1–1.0)
NEUTROS PCT: 55.5 % (ref 43.0–77.0)
Neutro Abs: 2.7 10*3/uL (ref 1.4–7.7)
Platelets: 179 10*3/uL (ref 150.0–400.0)
RBC: 4.52 Mil/uL (ref 3.87–5.11)
RDW: 12.8 % (ref 11.5–15.5)
WBC: 4.9 10*3/uL (ref 4.0–10.5)

## 2016-03-01 LAB — BASIC METABOLIC PANEL
BUN: 16 mg/dL (ref 6–23)
CO2: 27 meq/L (ref 19–32)
Calcium: 9.6 mg/dL (ref 8.4–10.5)
Chloride: 106 mEq/L (ref 96–112)
Creatinine, Ser: 0.68 mg/dL (ref 0.40–1.20)
GFR: 94.26 mL/min (ref >60.00)
GLUCOSE: 87 mg/dL (ref 70–99)
POTASSIUM: 4.2 meq/L (ref 3.5–5.1)
SODIUM: 141 meq/L (ref 135–145)

## 2016-03-01 LAB — TSH: TSH: 1.16 u[IU]/mL (ref 0.35–4.50)

## 2016-03-02 DIAGNOSIS — H2513 Age-related nuclear cataract, bilateral: Secondary | ICD-10-CM | POA: Diagnosis not present

## 2016-03-08 ENCOUNTER — Ambulatory Visit (INDEPENDENT_AMBULATORY_CARE_PROVIDER_SITE_OTHER): Payer: BLUE CROSS/BLUE SHIELD | Admitting: Family Medicine

## 2016-03-08 ENCOUNTER — Encounter: Payer: Self-pay | Admitting: Family Medicine

## 2016-03-08 VITALS — BP 127/77 | HR 82 | Temp 98.3°F | Ht 66.25 in | Wt 152.0 lb

## 2016-03-08 DIAGNOSIS — Z0000 Encounter for general adult medical examination without abnormal findings: Secondary | ICD-10-CM | POA: Diagnosis not present

## 2016-03-08 DIAGNOSIS — Z23 Encounter for immunization: Secondary | ICD-10-CM

## 2016-03-08 NOTE — Progress Notes (Signed)
Pre visit review using our clinic review tool, if applicable. No additional management support is needed unless otherwise documented below in the visit note. 

## 2016-03-08 NOTE — Progress Notes (Signed)
   Subjective:    Patient ID: Tracey Sweeney, female    DOB: Jun 24, 1955, 60 y.o.   MRN: [id number]  HPI 60 yr old female for a well exam. She feels well. Her last mammogram was normal.    Review of Systems  Constitutional: Negative.   HENT: Negative.   Eyes: Negative.   Respiratory: Negative.   Cardiovascular: Negative.   Gastrointestinal: Negative.   Genitourinary: Negative for decreased urine volume, difficulty urinating, dyspareunia, dysuria, enuresis, flank pain, frequency, hematuria, pelvic pain and urgency.  Musculoskeletal: Negative.   Skin: Negative.   Neurological: Negative.   Psychiatric/Behavioral: Negative.        Objective:   Physical Exam  Constitutional: She is oriented to person, place, and time. She appears well-developed and well-nourished. No distress.  HENT:  Head: Normocephalic and atraumatic.  Right Ear: External ear normal.  Left Ear: External ear normal.  Nose: Nose normal.  Mouth/Throat: Oropharynx is clear and moist. No oropharyngeal exudate.  Eyes: Conjunctivae and EOM are normal. Pupils are equal, round, and reactive to light. No scleral icterus.  Neck: Normal range of motion. Neck supple. No JVD present. No thyromegaly present.  Cardiovascular: Normal rate, regular rhythm, normal heart sounds and intact distal pulses.  Exam reveals no gallop and no friction rub.   No murmur heard. Pulmonary/Chest: Effort normal and breath sounds normal. No respiratory distress. She has no wheezes. She has no rales. She exhibits no tenderness.  Abdominal: Soft. Bowel sounds are normal. She exhibits no distension and no mass. There is no tenderness. There is no rebound and no guarding.  Musculoskeletal: Normal range of motion. She exhibits no edema or tenderness.  Lymphadenopathy:    She has no cervical adenopathy.  Neurological: She is alert and oriented to person, place, and time. She has normal reflexes. No cranial nerve deficit. She exhibits normal muscle tone.  Coordination normal.  Skin: Skin is warm and dry. No rash noted. No erythema.  Psychiatric: She has a normal mood and affect. Her behavior is normal. Judgment and thought content normal.          Assessment & Plan:  Well exam. We discussed diet and exercise .  Laurey Morale, MD

## 2016-04-25 DIAGNOSIS — R110 Nausea: Secondary | ICD-10-CM | POA: Diagnosis not present

## 2016-04-25 DIAGNOSIS — R05 Cough: Secondary | ICD-10-CM | POA: Diagnosis not present

## 2016-04-25 DIAGNOSIS — J0140 Acute pansinusitis, unspecified: Secondary | ICD-10-CM | POA: Diagnosis not present

## 2016-06-15 DIAGNOSIS — H524 Presbyopia: Secondary | ICD-10-CM | POA: Diagnosis not present

## 2016-06-15 DIAGNOSIS — H2513 Age-related nuclear cataract, bilateral: Secondary | ICD-10-CM | POA: Diagnosis not present

## 2016-06-27 ENCOUNTER — Ambulatory Visit (INDEPENDENT_AMBULATORY_CARE_PROVIDER_SITE_OTHER): Payer: BLUE CROSS/BLUE SHIELD | Admitting: Family Medicine

## 2016-06-27 ENCOUNTER — Encounter: Payer: Self-pay | Admitting: Family Medicine

## 2016-06-27 VITALS — BP 111/82 | HR 86 | Temp 98.6°F | Ht 66.25 in | Wt 154.0 lb

## 2016-06-27 DIAGNOSIS — Z9109 Other allergy status, other than to drugs and biological substances: Secondary | ICD-10-CM

## 2016-06-27 NOTE — Progress Notes (Signed)
   Subjective:    Patient ID: Tracey Sweeney, female    DOB: 1955/07/16, 61 y.o.   MRN: [id number]  HPI Here for one week of itchy and runny eyes (no DC or matting of the lids), PND, sneezing, and a dry cough. No fever. Using Zyrtec and Mucinex. She typically has springtime allergy problems.    Review of Systems  Constitutional: Negative.   HENT: Positive for congestion, postnasal drip, rhinorrhea and sneezing. Negative for ear pain, sinus pain, sinus pressure and sore throat.   Eyes: Positive for itching. Negative for pain, discharge, redness and visual disturbance.  Respiratory: Positive for cough. Negative for chest tightness, shortness of breath, wheezing and stridor.        Objective:   Physical Exam  Constitutional: She appears well-developed and well-nourished.  HENT:  Right Ear: External ear normal.  Left Ear: External ear normal.  Nose: Nose normal.  Mouth/Throat: Oropharynx is clear and moist.  Eyes: Conjunctivae are normal. Right eye exhibits no discharge. Left eye exhibits no discharge.  Neck: No thyromegaly present.  Pulmonary/Chest: Effort normal and breath sounds normal.  Lymphadenopathy:    She has no cervical adenopathy.          Assessment & Plan:  This appears to be allergic in nature. She will continue Mucinex. I suggested she try Xyzal daily rather than Zyrtec.  Alysia Penna, MD

## 2016-06-27 NOTE — Progress Notes (Signed)
Pre visit review using our clinic review tool, if applicable. No additional management support is needed unless otherwise documented below in the visit note. 

## 2016-07-17 DIAGNOSIS — H2512 Age-related nuclear cataract, left eye: Secondary | ICD-10-CM | POA: Diagnosis not present

## 2016-07-25 DIAGNOSIS — H43812 Vitreous degeneration, left eye: Secondary | ICD-10-CM | POA: Diagnosis not present

## 2016-07-25 DIAGNOSIS — H2513 Age-related nuclear cataract, bilateral: Secondary | ICD-10-CM | POA: Diagnosis not present

## 2016-07-25 DIAGNOSIS — H31092 Other chorioretinal scars, left eye: Secondary | ICD-10-CM | POA: Diagnosis not present

## 2016-07-25 DIAGNOSIS — H15833 Staphyloma posticum, bilateral: Secondary | ICD-10-CM | POA: Diagnosis not present

## 2016-07-31 DIAGNOSIS — H25812 Combined forms of age-related cataract, left eye: Secondary | ICD-10-CM | POA: Diagnosis not present

## 2016-07-31 DIAGNOSIS — H2512 Age-related nuclear cataract, left eye: Secondary | ICD-10-CM | POA: Diagnosis not present

## 2016-08-29 DIAGNOSIS — H2511 Age-related nuclear cataract, right eye: Secondary | ICD-10-CM | POA: Diagnosis not present

## 2016-09-04 DIAGNOSIS — H25811 Combined forms of age-related cataract, right eye: Secondary | ICD-10-CM | POA: Diagnosis not present

## 2016-09-04 DIAGNOSIS — H2511 Age-related nuclear cataract, right eye: Secondary | ICD-10-CM | POA: Diagnosis not present

## 2016-10-25 DIAGNOSIS — T8522XA Displacement of intraocular lens, initial encounter: Secondary | ICD-10-CM | POA: Diagnosis not present

## 2016-11-08 DIAGNOSIS — Z961 Presence of intraocular lens: Secondary | ICD-10-CM | POA: Diagnosis not present

## 2016-11-23 ENCOUNTER — Ambulatory Visit (INDEPENDENT_AMBULATORY_CARE_PROVIDER_SITE_OTHER): Payer: BLUE CROSS/BLUE SHIELD | Admitting: Family Medicine

## 2016-11-23 ENCOUNTER — Encounter: Payer: Self-pay | Admitting: Family Medicine

## 2016-11-23 VITALS — BP 96/70 | HR 89 | Temp 98.9°F | Ht 66.25 in | Wt 159.0 lb

## 2016-11-23 DIAGNOSIS — R509 Fever, unspecified: Secondary | ICD-10-CM | POA: Diagnosis not present

## 2016-11-23 DIAGNOSIS — H04123 Dry eye syndrome of bilateral lacrimal glands: Secondary | ICD-10-CM | POA: Diagnosis not present

## 2016-11-23 MED ORDER — DOXYCYCLINE HYCLATE 100 MG PO CAPS: 100.0000 mg | ORAL_CAPSULE | Freq: Two times a day (BID) | ORAL | 0 refills | Status: AC

## 2016-11-23 NOTE — Progress Notes (Signed)
   Subjective:    Patient ID: Tracey Sweeney, female    DOB: 1956-01-02, 61 y.o.   MRN: [id number]  HPI Here for the onset yesterday of fever to 99.4 degrees, headache, swollen nodes in the neck and body aches. Today the nodes have gone down and the headache is much better. No ST or cough. No NVD. No rashes. No recent travel. No known tick bites. Taking Tylenol and drinking fluids.    Review of Systems  Constitutional: Positive for fatigue.  HENT: Negative.   Eyes: Negative.   Respiratory: Negative.   Cardiovascular: Negative.   Gastrointestinal: Negative.   Genitourinary: Negative.   Musculoskeletal: Positive for myalgias.  Skin: Negative for rash.  Neurological: Positive for headaches.       Objective:   Physical Exam  Constitutional: She is oriented to person, place, and time. She appears well-developed and well-nourished. No distress.  HENT:  Right Ear: External ear normal.  Left Ear: External ear normal.  Nose: Nose normal.  Mouth/Throat: Oropharynx is clear and moist.  Eyes: Conjunctivae are normal.  Neck: Normal range of motion. Neck supple. No thyromegaly present.  Cardiovascular: Normal rate, regular rhythm, normal heart sounds and intact distal pulses.   Pulmonary/Chest: Effort normal and breath sounds normal. No respiratory distress. She has no wheezes. She has no rales.  Abdominal: Soft. Bowel sounds are normal. She exhibits no distension and no mass. There is no tenderness. There is no rebound and no guarding.  Lymphadenopathy:    She has no cervical adenopathy.  Neurological: She is alert and oriented to person, place, and time.  Skin: No rash noted.          Assessment & Plan:  Fever of unknown origin, possibly viral. She is concerned about possible tick borne illness so we will cover with Doxycycline. Recheck prn. Alysia Penna, MD

## 2016-11-23 NOTE — Patient Instructions (Signed)
WE NOW OFFER   [facility] FAST TRACK!!!  SAME DAY Appointments for ACUTE CARE  Such as: Sprains, Injuries, cuts, abrasions, rashes, muscle pain, joint pain, back pain Colds, flu, sore throats, headache, allergies, cough, fever  Ear pain, sinus and eye infections Abdominal pain, nausea, vomiting, diarrhea, upset stomach Animal/insect bites  3 Easy Ways to Schedule: Walk-In Scheduling Call in scheduling Mychart Sign-up: [email or web address]         

## 2016-11-30 DIAGNOSIS — N202 Calculus of kidney with calculus of ureter: Secondary | ICD-10-CM | POA: Diagnosis not present

## 2016-11-30 DIAGNOSIS — R109 Unspecified abdominal pain: Secondary | ICD-10-CM | POA: Diagnosis not present

## 2016-11-30 DIAGNOSIS — N201 Calculus of ureter: Secondary | ICD-10-CM | POA: Diagnosis not present

## 2016-11-30 DIAGNOSIS — N200 Calculus of kidney: Secondary | ICD-10-CM | POA: Diagnosis not present

## 2016-11-30 DIAGNOSIS — N1330 Unspecified hydronephrosis: Secondary | ICD-10-CM | POA: Diagnosis not present

## 2016-11-30 DIAGNOSIS — R310 Gross hematuria: Secondary | ICD-10-CM | POA: Diagnosis not present

## 2016-12-04 DIAGNOSIS — N201 Calculus of ureter: Secondary | ICD-10-CM | POA: Diagnosis not present

## 2016-12-04 DIAGNOSIS — N132 Hydronephrosis with renal and ureteral calculous obstruction: Secondary | ICD-10-CM | POA: Diagnosis not present

## 2016-12-13 DIAGNOSIS — N200 Calculus of kidney: Secondary | ICD-10-CM | POA: Diagnosis not present

## 2016-12-13 DIAGNOSIS — N201 Calculus of ureter: Secondary | ICD-10-CM | POA: Diagnosis not present

## 2016-12-26 DIAGNOSIS — N200 Calculus of kidney: Secondary | ICD-10-CM | POA: Diagnosis not present

## 2016-12-27 DIAGNOSIS — N200 Calculus of kidney: Secondary | ICD-10-CM | POA: Diagnosis not present

## 2017-01-08 DIAGNOSIS — N200 Calculus of kidney: Secondary | ICD-10-CM | POA: Diagnosis not present

## 2017-01-15 DIAGNOSIS — Z1231 Encounter for screening mammogram for malignant neoplasm of breast: Secondary | ICD-10-CM | POA: Diagnosis not present

## 2017-01-18 ENCOUNTER — Encounter: Payer: Self-pay | Admitting: Family Medicine

## 2017-01-22 ENCOUNTER — Encounter: Payer: Self-pay | Admitting: Family Medicine

## 2017-01-22 ENCOUNTER — Ambulatory Visit (INDEPENDENT_AMBULATORY_CARE_PROVIDER_SITE_OTHER): Payer: BLUE CROSS/BLUE SHIELD | Admitting: Family Medicine

## 2017-01-22 VITALS — BP 110/88 | HR 104 | Temp 98.7°F | Ht 66.25 in | Wt 156.0 lb

## 2017-01-22 DIAGNOSIS — Z23 Encounter for immunization: Secondary | ICD-10-CM | POA: Diagnosis not present

## 2017-01-22 DIAGNOSIS — N200 Calculus of kidney: Secondary | ICD-10-CM | POA: Diagnosis not present

## 2017-01-22 MED ORDER — DICLOFENAC SODIUM 75 MG PO TBEC: 75.0000 mg | DELAYED_RELEASE_TABLET | Freq: Two times a day (BID) | ORAL | 2 refills | Status: DC

## 2017-01-22 NOTE — Progress Notes (Signed)
   Subjective:    Patient ID: Tracey Sweeney, female    DOB: 1955-07-13, 61 y.o.   MRN: [id number]  HPI Here for several issues. First she has had bilateral kidney stones recently and stone analyses has shown these to consist of calcium oxalate. She asks for dietary advice about these. Second for one month she has had a painful knot on top of the left foot. No hxof trauma. No redness or selling or warmth. Third for 2 weeks she has had a painful lump on the left 3rd finger.    Review of Systems  Constitutional: Negative.   Respiratory: Negative.   Cardiovascular: Negative.   Genitourinary: Negative.   Musculoskeletal: Positive for arthralgias and joint swelling.       Objective:   Physical Exam  Constitutional: She appears well-developed and well-nourished.  Cardiovascular: Normal rate, regular rhythm, normal heart sounds and intact distal pulses.   Pulmonary/Chest: Effort normal and breath sounds normal. No respiratory distress. She has no wheezes. She has no rales.  Abdominal: Soft. Bowel sounds are normal. She exhibits no distension and no mass. There is no tenderness. There is no rebound and no guarding.  Musculoskeletal:  There is a tender mobile round lump over the left dorsal foot at the origin of the 3rd metatarsal. There is also tenderness and swelling over the left 3rd MCP joint.          Assessment & Plan:  She has a ganglion cyst on the left forefoot. She can apply ice packs and take Diclofenac prn. For the finger tenderness this is consistent with arthritis, and the Diclofenac will help. For the kidney stones we will arrange for her to meet with Nutrition to decrease the calcium and the oxalate in there diet.  Alysia Penna, MD

## 2017-01-22 NOTE — Patient Instructions (Signed)
WE NOW OFFER   [facility] FAST TRACK!!!  SAME DAY Appointments for ACUTE CARE  Such as: Sprains, Injuries, cuts, abrasions, rashes, muscle pain, joint pain, back pain Colds, flu, sore throats, headache, allergies, cough, fever  Ear pain, sinus and eye infections Abdominal pain, nausea, vomiting, diarrhea, upset stomach Animal/insect bites  3 Easy Ways to Schedule: Walk-In Scheduling Call in scheduling Mychart Sign-up: [email or web address]         

## 2017-02-16 ENCOUNTER — Encounter: Payer: BLUE CROSS/BLUE SHIELD | Attending: Family Medicine | Admitting: Dietician

## 2017-02-16 ENCOUNTER — Encounter: Payer: Self-pay | Admitting: Dietician

## 2017-02-16 DIAGNOSIS — Z713 Dietary counseling and surveillance: Secondary | ICD-10-CM | POA: Insufficient documentation

## 2017-02-16 DIAGNOSIS — N200 Calculus of kidney: Secondary | ICD-10-CM | POA: Diagnosis not present

## 2017-02-16 NOTE — Progress Notes (Signed)
  Medical Nutrition Therapy:  Appt start time: 1100 end time:  1601.   Assessment:  Primary concerns today: Patient is here today alone.  She would like to know what to eat to avoid kidney stones.  She has had 7 kidney stones over the past 5 years.  She has been researching on the Internet and has seen a discrepancy of information regarding foods high in oxalates.  She reports that her 24 hour urine calcium and oxalate were both very elevated.  She was advised to follow a low sodium and low animal product diet. Weight 151 lbs.  Other hx includes GERD.  Patient lives alone.  She is Semi-retired and works in the [employer] area.  She does cook.  Preferred Learning Style:   No preference indicated   Learning Readiness:   Ready  Change in progress   MEDICATIONS: see list   DIETARY INTAKE:  Usual eating pattern includes 3 meals and 0-1 snacks per day.  Everyday foods include fresh vegetables,  Avoided foods include milk, tea  24-hr recall:  B ( AM): Oatmeal, raisins, cinnamon, OJ coffee with cream OR [employer] toast and avocado but less after learning it is higher in oxalate on the internet  OR toast with egg occasionally OR rye toast, butter, fruit Snk ( AM): none  L ( PM): leftovers from dinner OR sandwich (cucmber on rye, mayo or hummus) Snk ( PM): used to eat nuts and bean chips D ( PM): quinoa, vegetables and legumes and baked fish, chicken or [place] 3 days per week Snk ( PM): none Beverages: 84-96 ounces of water daily, OJ, 1 cup coffee with cream, rare soda  Usual physical activity: walking until the past month due to problems with her foot.  Was walking 3 1/2 miles each am.   Progress Towards Goal(s):  In progress.   Nutritional Diagnosis:  NB-1.1 Food and nutrition-related knowledge deficit As related to nutrition recomendations for calcium oxalate kidney stones.  As evidenced by patient report.    Intervention:  Nutrition education related to nutrition  therapy for calcium oxalate kidney stones.  Continue the low sodium diet Continue to limit animal protein and continue to eat more plant based. Continue to aim for 96 ounces of water daily. Continue to limit the high oxalate foods. Consider a probiotic (one with more than 3 strains).  Teaching Method Utilized:  Visual Auditory  Handouts given during visit include:  Kidney stone nutrition therapy from [facility]  Kidney stone nutrition tips from [facility]  Barriers to learning/adherence to lifestyle change: none  Demonstrated degree of understanding via:  Teach Back   Monitoring/Evaluation:  Dietary intake, exercise, and body weight prn.

## 2017-02-16 NOTE — Patient Instructions (Signed)
Continue the low sodium diet Continue to limit animal protein and continue to eat more plant based. Continue to aim for 96 ounces of water daily. Continue to limit the high oxalate foods. Consider a probiotic (one with more than 3 strains).

## 2017-03-08 ENCOUNTER — Ambulatory Visit (INDEPENDENT_AMBULATORY_CARE_PROVIDER_SITE_OTHER): Payer: BLUE CROSS/BLUE SHIELD | Admitting: Family Medicine

## 2017-03-08 ENCOUNTER — Encounter: Payer: Self-pay | Admitting: Family Medicine

## 2017-03-08 VITALS — BP 120/70 | Temp 98.2°F | Ht 67.0 in | Wt 153.0 lb

## 2017-03-08 DIAGNOSIS — M79672 Pain in left foot: Secondary | ICD-10-CM | POA: Diagnosis not present

## 2017-03-08 DIAGNOSIS — Z0000 Encounter for general adult medical examination without abnormal findings: Secondary | ICD-10-CM | POA: Diagnosis not present

## 2017-03-08 LAB — POC URINALSYSI DIPSTICK (AUTOMATED)
Bilirubin, UA: NEGATIVE
Blood, UA: NEGATIVE
Glucose, UA: NEGATIVE
Ketones, UA: NEGATIVE
Leukocytes, UA: NEGATIVE
Nitrite, UA: NEGATIVE
Protein, UA: NEGATIVE
Spec Grav, UA: 1.025 (ref 1.010–1.025)
Urobilinogen, UA: 0.2 E.U./dL
pH, UA: 6 (ref 5.0–8.0)

## 2017-03-08 LAB — HEPATIC FUNCTION PANEL
ALT: 19 U/L (ref 0–35)
AST: 20 U/L (ref 0–37)
Albumin: 4.5 g/dL (ref 3.5–5.2)
Alkaline Phosphatase: 64 U/L (ref 39–117)
Bilirubin, Direct: 0.1 mg/dL (ref 0.0–0.3)
Total Bilirubin: 0.5 mg/dL (ref 0.2–1.2)
Total Protein: 6.8 g/dL (ref 6.0–8.3)

## 2017-03-08 LAB — LIPID PANEL
Cholesterol: 215 mg/dL — ABNORMAL HIGH (ref 0–200)
HDL: 52.4 mg/dL (ref >39.00)
LDL CALC: 143 mg/dL — AB (ref 0–99)
NONHDL: 162.73
Total CHOL/HDL Ratio: 4
Triglycerides: 97 mg/dL (ref 0.0–149.0)
VLDL: 19.4 mg/dL (ref 0.0–40.0)

## 2017-03-08 LAB — BASIC METABOLIC PANEL
BUN: 12 mg/dL (ref 6–23)
CALCIUM: 9.8 mg/dL (ref 8.4–10.5)
CO2: 30 meq/L (ref 19–32)
CREATININE: 0.64 mg/dL (ref 0.40–1.20)
Chloride: 103 mEq/L (ref 96–112)
GFR: 100.74 mL/min (ref >60.00)
GLUCOSE: 88 mg/dL (ref 70–99)
Potassium: 4.4 mEq/L (ref 3.5–5.1)
Sodium: 140 mEq/L (ref 135–145)

## 2017-03-08 LAB — CBC WITH DIFFERENTIAL/PLATELET
BASOS ABS: 0.1 10*3/uL (ref 0.0–0.1)
Basophils Relative: 1.1 % (ref 0.0–3.0)
EOS ABS: 0.1 10*3/uL (ref 0.0–0.7)
Eosinophils Relative: 1.3 % (ref 0.0–5.0)
HCT: 41.2 % (ref 36.0–46.0)
Hemoglobin: 13.5 g/dL (ref 12.0–15.0)
LYMPHS ABS: 1.7 10*3/uL (ref 0.7–4.0)
LYMPHS PCT: 33.1 % (ref 12.0–46.0)
MCHC: 32.8 g/dL (ref 30.0–36.0)
MCV: 89.9 fl (ref 78.0–100.0)
Monocytes Absolute: 0.4 10*3/uL (ref 0.1–1.0)
Monocytes Relative: 8.7 % (ref 3.0–12.0)
NEUTROS ABS: 2.8 10*3/uL (ref 1.4–7.7)
NEUTROS PCT: 55.8 % (ref 43.0–77.0)
PLATELETS: 225 10*3/uL (ref 150.0–400.0)
RBC: 4.59 Mil/uL (ref 3.87–5.11)
RDW: 13.6 % (ref 11.5–15.5)
WBC: 5.1 10*3/uL (ref 4.0–10.5)

## 2017-03-08 LAB — TSH: TSH: 0.65 u[IU]/mL (ref 0.35–4.50)

## 2017-03-08 NOTE — Progress Notes (Signed)
   Subjective:    Patient ID: Tracey Sweeney, female    DOB: 1955-11-04, 61 y.o.   MRN: [id number]  HPI Here for a well exam. She is doing well except for a painful knot on the top of the left foot. She wears tennis shoes most of the time.    Review of Systems  Constitutional: Negative.   HENT: Negative.   Eyes: Negative.   Respiratory: Negative.   Cardiovascular: Negative.   Gastrointestinal: Negative.   Genitourinary: Negative for decreased urine volume, difficulty urinating, dyspareunia, dysuria, enuresis, flank pain, frequency, hematuria, pelvic pain and urgency.  Musculoskeletal: Negative.   Skin: Negative.   Neurological: Negative.   Psychiatric/Behavioral: Negative.        Objective:   Physical Exam  Constitutional: She is oriented to person, place, and time. She appears well-developed and well-nourished. No distress.  HENT:  Head: Normocephalic and atraumatic.  Right Ear: External ear normal.  Left Ear: External ear normal.  Nose: Nose normal.  Mouth/Throat: Oropharynx is clear and moist. No oropharyngeal exudate.  Eyes: Conjunctivae and EOM are normal. Pupils are equal, round, and reactive to light. No scleral icterus.  Neck: Normal range of motion. Neck supple. No JVD present. No thyromegaly present.  Cardiovascular: Normal rate, regular rhythm, normal heart sounds and intact distal pulses. Exam reveals no gallop and no friction rub.  No murmur heard. Pulmonary/Chest: Effort normal and breath sounds normal. No respiratory distress. She has no wheezes. She has no rales. She exhibits no tenderness.  Abdominal: Soft. Bowel sounds are normal. She exhibits no distension and no mass. There is no tenderness. There is no rebound and no guarding.  Musculoskeletal: Normal range of motion. She exhibits no edema or tenderness.  Lymphadenopathy:    She has no cervical adenopathy.  Neurological: She is alert and oriented to person, place, and time. She has normal reflexes. No cranial  nerve deficit. She exhibits normal muscle tone. Coordination normal.  Skin: Skin is warm and dry. No rash noted. No erythema.  Psychiatric: She has a normal mood and affect. Her behavior is normal. Judgment and thought content normal.          Assessment & Plan:  Well exam. We discussed diet and exercise. Get fasting labs. Refer to Podiatry for the foot pain. Alysia Penna, MD

## 2017-03-08 NOTE — Patient Instructions (Signed)
WE NOW OFFER   [facility] FAST TRACK!!!  SAME DAY Appointments for ACUTE CARE  Such as: Sprains, Injuries, cuts, abrasions, rashes, muscle pain, joint pain, back pain Colds, flu, sore throats, headache, allergies, cough, fever  Ear pain, sinus and eye infections Abdominal pain, nausea, vomiting, diarrhea, upset stomach Animal/insect bites  3 Easy Ways to Schedule: Walk-In Scheduling Call in scheduling Mychart Sign-up: [email or web address]         

## 2017-03-16 ENCOUNTER — Ambulatory Visit (INDEPENDENT_AMBULATORY_CARE_PROVIDER_SITE_OTHER): Payer: BLUE CROSS/BLUE SHIELD

## 2017-03-16 ENCOUNTER — Encounter: Payer: Self-pay | Admitting: Podiatry

## 2017-03-16 ENCOUNTER — Ambulatory Visit: Payer: BLUE CROSS/BLUE SHIELD | Admitting: Podiatry

## 2017-03-16 DIAGNOSIS — M2012 Hallux valgus (acquired), left foot: Secondary | ICD-10-CM | POA: Diagnosis not present

## 2017-03-16 DIAGNOSIS — M79672 Pain in left foot: Secondary | ICD-10-CM | POA: Diagnosis not present

## 2017-03-16 DIAGNOSIS — M19072 Primary osteoarthritis, left ankle and foot: Secondary | ICD-10-CM | POA: Diagnosis not present

## 2017-03-16 DIAGNOSIS — M898X9 Other specified disorders of bone, unspecified site: Secondary | ICD-10-CM

## 2017-03-16 NOTE — Progress Notes (Signed)
Subjective:    Patient ID: Tracey Sweeney, female    DOB: Sep 29, 1955, 61 y.o.   MRN: [id number]  HPI [name] presents to the office today for concerns of pain in the top of the left foot which is been ongoing for about 1 year.  She states that she walks 5 days a week approximately 3.5 miles and when she does that she gets pain in the top of her foot.  She denies any recent injury or trauma but she does have a remote injury where she did twist her foot.  She denies any recent increase in swelling or redness.  She has had no recent treatment for this.  She has no other concerns.  Review of Systems  All other systems reviewed and are negative.  Past Medical History:  Diagnosis Date  . Allergy   . GERD (gastroesophageal reflux disease)   . Insomnia   . Kidney stones dec 2014  . Varicose veins of legs     Past Surgical History:  Procedure Laterality Date  . cervix laser surgery    . COLONOSCOPY  09-03-13   per Dr. Oretha Caprice, adenomatous polyps, repeat in 5 yrs   . LITHOTRIPSY  2014  . TONSILLECTOMY    . varicose veins     Dr. Eilleen Kempf     Current Outpatient Medications:  .  levocetirizine (XYZAL) 5 MG tablet, Take 5 mg by mouth every evening. , Disp: , Rfl:   Allergies  Allergen Reactions  . Etodolac     Made stomach bleed  . Oxytetracycline     REACTION: syncope  . Penicillins     REACTION: Hives  . Sulfonamide Derivatives     REACTION: Nausea/vomiting    Social History   Socioeconomic History  . Marital status: Single    Spouse name: Not on file  . Number of children: Not on file  . Years of education: Not on file  . Highest education level: Not on file  Social Needs  . Financial resource strain: Not on file  . Food insecurity - worry: Not on file  . Food insecurity - inability: Not on file  . Transportation needs - medical: Not on file  . Transportation needs - non-medical: Not on file  Occupational History  . Not on file  Tobacco Use  . Smoking status:  Never Smoker  . Smokeless tobacco: Never Used  Substance and Sexual Activity  . Alcohol use: No    Alcohol/week: 0.0 oz  . Drug use: No  . Sexual activity: Not on file  Other Topics Concern  . Not on file  Social History Narrative  . Not on file         Objective:   Physical Exam  General: AAO x3, NAD  Dermatological: Skin is warm, dry and supple bilateral. Nails x 10 are well manicured; remaining integument appears unremarkable at this time. There are no open sores, no preulcerative lesions, no rash or signs of infection present.  Vascular: Dorsalis Pedis artery and Posterior Tibial artery pedal pulses are 2/4 bilateral with immedate capillary fill time.  There is no pain with calf compression, swelling, warmth, erythema.   Neruologic: Grossly intact via light touch bilateral. Protective threshold with Semmes Wienstein monofilament intact to all pedal sites bilateral.  Negative Tinel sign  Musculoskeletal: Moderate to significant rigidity is present.  There is first ray hypomobility present.  On the dorsal second metatarsal cuneiform joint there is a small bony exostosis that is mild  tenderness palpation of this area.  There is no significant erythema, edema.  There is no other areas of tenderness.  Muscular strength 5/5 in all groups tested bilateral.  Gait: Unassisted, Nonantalgic.      Assessment & Plan:  61 year old female with right foot bony exostosis, osteoarthritis -Treatment options discussed including all alternatives, risks, and complications -Etiology of symptoms were discussed -X-rays were obtained and reviewed with the patient.  Arthritic changes present the second metatarsal cuneiform joint and there is a small bony exostosis palpable to this area. -We discussed treatment options for this both conservative as well as surgical.  She had no treatment to start with conservative treatment.  I discussed shoe modifications as well as releasing her shoes to help take  pressure off the area.  Also discussed insert to help support her foot.  Discussed a steroid injection as well.  Discussed topical anti-inflammatory medicines.  Trula Slade DPM

## 2017-03-27 ENCOUNTER — Telehealth: Payer: Self-pay | Admitting: Family Medicine

## 2017-03-27 DIAGNOSIS — M79645 Pain in left finger(s): Secondary | ICD-10-CM

## 2017-03-27 NOTE — Telephone Encounter (Signed)
Copied from [facility]. Topic: Referral - Request >> Mar 27, 2017 11:03 AM Synthia Innocent wrote: Reason for CRM: requesting referral for pain in middle finger on left hand, patient states she has discussed with Dr Sarajane Jews before and it is not getting better.

## 2017-03-27 NOTE — Telephone Encounter (Signed)
Sent to PCP to place referral  

## 2017-03-27 NOTE — Telephone Encounter (Signed)
Referral was done  

## 2017-03-28 NOTE — Telephone Encounter (Signed)
Called pt and advised to call back. CRM created.

## 2017-03-28 NOTE — Telephone Encounter (Signed)
Pt advised and voiced understanding. Pt stated that they called her today and scheduled her appt.

## 2017-04-02 ENCOUNTER — Telehealth: Payer: Self-pay | Admitting: Family Medicine

## 2017-04-02 DIAGNOSIS — M79641 Pain in right hand: Secondary | ICD-10-CM | POA: Diagnosis not present

## 2017-04-02 DIAGNOSIS — M79642 Pain in left hand: Secondary | ICD-10-CM | POA: Diagnosis not present

## 2017-04-02 DIAGNOSIS — M069 Rheumatoid arthritis, unspecified: Secondary | ICD-10-CM | POA: Diagnosis not present

## 2017-04-02 DIAGNOSIS — M65332 Trigger finger, left middle finger: Secondary | ICD-10-CM | POA: Diagnosis not present

## 2017-04-02 NOTE — Telephone Encounter (Signed)
Sent to PCP ?

## 2017-04-02 NOTE — Telephone Encounter (Signed)
Spoke with pt. Pt advised and voiced understanding.  

## 2017-04-02 NOTE — Telephone Encounter (Signed)
Copied from [place]. Topic: General - Other >> Apr 02, 2017  1:24 PM Synthia Innocent wrote: Reason for CRM: Requesting labs for Dr Fredna Dow be done at [facility]. Labs needed are anti CCP order [id number], RA Factor screen Order [id number], CRP High sen order [id number], Uric Acide order [id number], sed rate ESR order [id number], CBC order [id number]

## 2017-04-02 NOTE — Telephone Encounter (Signed)
We cannot draw these at [facility]. I suggest either the outpatient lab at [facility] or go to a [place] office

## 2017-04-03 DIAGNOSIS — M069 Rheumatoid arthritis, unspecified: Secondary | ICD-10-CM | POA: Diagnosis not present

## 2017-04-16 DIAGNOSIS — M65332 Trigger finger, left middle finger: Secondary | ICD-10-CM | POA: Diagnosis not present

## 2017-04-16 DIAGNOSIS — M069 Rheumatoid arthritis, unspecified: Secondary | ICD-10-CM | POA: Diagnosis not present

## 2017-06-22 DIAGNOSIS — E663 Overweight: Secondary | ICD-10-CM | POA: Diagnosis not present

## 2017-06-22 DIAGNOSIS — Z6825 Body mass index (BMI) 25.0-25.9, adult: Secondary | ICD-10-CM | POA: Diagnosis not present

## 2017-06-22 DIAGNOSIS — M79641 Pain in right hand: Secondary | ICD-10-CM | POA: Diagnosis not present

## 2017-06-22 DIAGNOSIS — M79642 Pain in left hand: Secondary | ICD-10-CM | POA: Diagnosis not present

## 2017-06-22 DIAGNOSIS — M064 Inflammatory polyarthropathy: Secondary | ICD-10-CM | POA: Diagnosis not present

## 2017-06-25 ENCOUNTER — Encounter: Payer: Self-pay | Admitting: Family Medicine

## 2017-06-25 ENCOUNTER — Ambulatory Visit: Payer: BLUE CROSS/BLUE SHIELD | Admitting: Family Medicine

## 2017-06-25 VITALS — BP 132/70 | HR 92 | Temp 98.7°F | Wt 156.8 lb

## 2017-06-25 DIAGNOSIS — A084 Viral intestinal infection, unspecified: Secondary | ICD-10-CM | POA: Diagnosis not present

## 2017-06-25 DIAGNOSIS — K219 Gastro-esophageal reflux disease without esophagitis: Secondary | ICD-10-CM | POA: Diagnosis not present

## 2017-06-25 MED ORDER — OMEPRAZOLE 40 MG PO CPDR
40.0000 mg | DELAYED_RELEASE_CAPSULE | Freq: Every day | ORAL | 3 refills | Status: DC
Start: 2017-06-25 — End: 2018-03-11

## 2017-06-25 NOTE — Progress Notes (Signed)
   Subjective:    Patient ID: KARLINA SUARES, female    DOB: Apr 20, 1956, 62 y.o.   MRN: [id number]  HPI Here for 24 hours of nausea with one episode of vomiting, and a lot of heartburn. No fever or body aches or URI symptoms. Her BMs are normal. Also she tells me she is seeing Dr. Gavin Pound for arthiritis, possibly rheumatoid. She plans to start [facility] on Celebrex soon.    Review of Systems  Constitutional: Negative.   Eyes: Negative.   Respiratory: Negative.   Cardiovascular: Negative.   Gastrointestinal: Positive for nausea and vomiting. Negative for diarrhea.  Musculoskeletal: Positive for arthralgias.       Objective:   Physical Exam  Constitutional: She appears well-developed and well-nourished. No distress.  HENT:  Right Ear: External ear normal.  Left Ear: External ear normal.  Nose: Nose normal.  Mouth/Throat: Oropharynx is clear and moist.  Eyes: Conjunctivae are normal.  Neck: No thyromegaly present.  Cardiovascular: Normal rate, regular rhythm, normal heart sounds and intact distal pulses.  Pulmonary/Chest: Effort normal and breath sounds normal. No respiratory distress. She has no wheezes. She has no rales.  Abdominal: Soft. Bowel sounds are normal. She exhibits no distension and no mass. There is no tenderness. There is no rebound and no guarding.  Lymphadenopathy:    She has no cervical adenopathy.          Assessment & Plan:  She seems to have a viral enteritis and this seems to be resolving already. This has caused a lot of reflux however. Since she will be going on Celebrex soon we will go ahead and start her on Prilosec 40 mg daily. Recheck prn. Alysia Penna, MD

## 2017-06-27 ENCOUNTER — Telehealth: Payer: Self-pay | Admitting: Family Medicine

## 2017-06-27 NOTE — Telephone Encounter (Signed)
Copied from [place] [id number]. Topic: Quick Communication - See Telephone Encounter >> Jun 27, 2017  1:45 PM Burnis Medin, NT wrote: CRM for notification. See Telephone encounter for: Jenny Reichmann is calling to see if patient's recent labs and hep c screening results can be faxed to [phone].   06/27/17.

## 2017-06-27 NOTE — Telephone Encounter (Signed)
Called [facility] to clarify results needed as last labs were in November 2018 and Hep screening was in 2016. They stated they just needed most recent labs and Hep C. I called the patient to verify this and she did request that we fax these labs. Labs faxed. Fax confirmation received.

## 2017-07-19 DIAGNOSIS — M0579 Rheumatoid arthritis with rheumatoid factor of multiple sites without organ or systems involvement: Secondary | ICD-10-CM | POA: Diagnosis not present

## 2017-07-19 DIAGNOSIS — M79641 Pain in right hand: Secondary | ICD-10-CM | POA: Diagnosis not present

## 2017-07-19 DIAGNOSIS — M79642 Pain in left hand: Secondary | ICD-10-CM | POA: Diagnosis not present

## 2017-07-30 ENCOUNTER — Encounter: Payer: Self-pay | Admitting: Family Medicine

## 2017-07-30 ENCOUNTER — Ambulatory Visit: Payer: BLUE CROSS/BLUE SHIELD | Admitting: Family Medicine

## 2017-07-30 VITALS — BP 124/68 | HR 88 | Temp 98.8°F | Ht 67.0 in | Wt 152.8 lb

## 2017-07-30 DIAGNOSIS — R1032 Left lower quadrant pain: Secondary | ICD-10-CM

## 2017-07-30 LAB — POCT URINALYSIS DIPSTICK
BILIRUBIN UA: NEGATIVE
Blood, UA: NEGATIVE
GLUCOSE UA: NEGATIVE
Ketones, UA: NEGATIVE
LEUKOCYTES UA: NEGATIVE
Nitrite, UA: NEGATIVE
Protein, UA: NEGATIVE
Spec Grav, UA: 1.015 (ref 1.010–1.025)
Urobilinogen, UA: 0.2 E.U./dL
pH, UA: 6 (ref 5.0–8.0)

## 2017-07-30 MED ORDER — METRONIDAZOLE 500 MG PO TABS: 500.0000 mg | ORAL_TABLET | Freq: Three times a day (TID) | ORAL | 0 refills | Status: DC

## 2017-07-30 MED ORDER — CIPROFLOXACIN HCL 500 MG PO TABS
500.0000 mg | ORAL_TABLET | Freq: Two times a day (BID) | ORAL | 0 refills | Status: DC
Start: 2017-07-30 — End: 2018-03-11

## 2017-07-30 MED ORDER — ONDANSETRON HCL 8 MG PO TABS: 8.0000 mg | ORAL_TABLET | Freq: Three times a day (TID) | ORAL | 1 refills | Status: DC | PRN

## 2017-07-30 NOTE — Progress Notes (Signed)
   Subjective:    Patient ID: Tracey Sweeney, female    DOB: [birth year], 62 y.o.   MRN: [id number]  HPI Here with another bout of GI issues. She was here 5 weeks ago with abdominal cramps and diarrhea, which was felt to be viral in nature. This resolved on its own in about a week. She felt back to normal until 3 days ago she she developed left sided abdominal pains, fever, diarrhea, and nausea with vomiting. Over the past 24 hours the fever has resolved. She has been sipping water but she has eaten very little food. No recent travel. Her colonoscopy 4 years ago was clear except for 2 adenomatous polyps.    Review of Systems  Constitutional: Positive for fever.  Respiratory: Negative.   Cardiovascular: Negative.   Gastrointestinal: Positive for abdominal pain, diarrhea, nausea and vomiting. Negative for abdominal distention, anal bleeding, blood in stool, constipation and rectal pain.  Genitourinary: Negative.        Objective:   Physical Exam  Constitutional: She appears well-developed and well-nourished. No distress.  Cardiovascular: Normal rate, regular rhythm, normal heart sounds and intact distal pulses.  Pulmonary/Chest: Effort normal and breath sounds normal. No respiratory distress. She has no wheezes. She has no rales.  Abdominal: Soft. Bowel sounds are normal. She exhibits no distension and no mass. There is no rebound and no guarding.  Tender in the left flank and LLQ           Assessment & Plan:  Probable diverticulitis. Treat with Cipro and Flagyl. Use Zofran for nausea. Drink fluids and slowly advance the diet.  Alysia Penna, MD

## 2017-08-01 ENCOUNTER — Encounter: Payer: Self-pay | Admitting: Family Medicine

## 2017-08-06 NOTE — Telephone Encounter (Signed)
Have her stop the Metronidazole but finish out the Cipro

## 2017-10-18 DIAGNOSIS — M79642 Pain in left hand: Secondary | ICD-10-CM | POA: Diagnosis not present

## 2017-10-18 DIAGNOSIS — M79641 Pain in right hand: Secondary | ICD-10-CM | POA: Diagnosis not present

## 2017-10-18 DIAGNOSIS — M0579 Rheumatoid arthritis with rheumatoid factor of multiple sites without organ or systems involvement: Secondary | ICD-10-CM | POA: Diagnosis not present

## 2017-12-25 DIAGNOSIS — M9901 Segmental and somatic dysfunction of cervical region: Secondary | ICD-10-CM | POA: Diagnosis not present

## 2018-01-01 DIAGNOSIS — M9901 Segmental and somatic dysfunction of cervical region: Secondary | ICD-10-CM | POA: Diagnosis not present

## 2018-01-02 DIAGNOSIS — M9901 Segmental and somatic dysfunction of cervical region: Secondary | ICD-10-CM | POA: Diagnosis not present

## 2018-01-03 DIAGNOSIS — M9901 Segmental and somatic dysfunction of cervical region: Secondary | ICD-10-CM | POA: Diagnosis not present

## 2018-01-07 DIAGNOSIS — M9901 Segmental and somatic dysfunction of cervical region: Secondary | ICD-10-CM | POA: Diagnosis not present

## 2018-01-08 DIAGNOSIS — M9901 Segmental and somatic dysfunction of cervical region: Secondary | ICD-10-CM | POA: Diagnosis not present

## 2018-01-10 DIAGNOSIS — M9901 Segmental and somatic dysfunction of cervical region: Secondary | ICD-10-CM | POA: Diagnosis not present

## 2018-01-11 DIAGNOSIS — Z87442 Personal history of urinary calculi: Secondary | ICD-10-CM | POA: Diagnosis not present

## 2018-01-11 DIAGNOSIS — N200 Calculus of kidney: Secondary | ICD-10-CM | POA: Diagnosis not present

## 2018-01-15 DIAGNOSIS — M9901 Segmental and somatic dysfunction of cervical region: Secondary | ICD-10-CM | POA: Diagnosis not present

## 2018-01-16 ENCOUNTER — Encounter: Payer: Self-pay | Admitting: Family Medicine

## 2018-01-16 DIAGNOSIS — Z1231 Encounter for screening mammogram for malignant neoplasm of breast: Secondary | ICD-10-CM | POA: Diagnosis not present

## 2018-01-17 DIAGNOSIS — M9901 Segmental and somatic dysfunction of cervical region: Secondary | ICD-10-CM | POA: Diagnosis not present

## 2018-01-21 DIAGNOSIS — M9901 Segmental and somatic dysfunction of cervical region: Secondary | ICD-10-CM | POA: Diagnosis not present

## 2018-01-24 DIAGNOSIS — M9901 Segmental and somatic dysfunction of cervical region: Secondary | ICD-10-CM | POA: Diagnosis not present

## 2018-01-28 DIAGNOSIS — M9901 Segmental and somatic dysfunction of cervical region: Secondary | ICD-10-CM | POA: Diagnosis not present

## 2018-01-31 DIAGNOSIS — M9901 Segmental and somatic dysfunction of cervical region: Secondary | ICD-10-CM | POA: Diagnosis not present

## 2018-02-05 DIAGNOSIS — M9901 Segmental and somatic dysfunction of cervical region: Secondary | ICD-10-CM | POA: Diagnosis not present

## 2018-02-07 DIAGNOSIS — M9901 Segmental and somatic dysfunction of cervical region: Secondary | ICD-10-CM | POA: Diagnosis not present

## 2018-02-14 ENCOUNTER — Ambulatory Visit (INDEPENDENT_AMBULATORY_CARE_PROVIDER_SITE_OTHER): Payer: BLUE CROSS/BLUE SHIELD | Admitting: *Deleted

## 2018-02-14 DIAGNOSIS — Z23 Encounter for immunization: Secondary | ICD-10-CM | POA: Diagnosis not present

## 2018-02-14 DIAGNOSIS — M9901 Segmental and somatic dysfunction of cervical region: Secondary | ICD-10-CM | POA: Diagnosis not present

## 2018-02-21 DIAGNOSIS — M9901 Segmental and somatic dysfunction of cervical region: Secondary | ICD-10-CM | POA: Diagnosis not present

## 2018-02-28 DIAGNOSIS — M9901 Segmental and somatic dysfunction of cervical region: Secondary | ICD-10-CM | POA: Diagnosis not present

## 2018-03-07 DIAGNOSIS — M9901 Segmental and somatic dysfunction of cervical region: Secondary | ICD-10-CM | POA: Diagnosis not present

## 2018-03-11 ENCOUNTER — Encounter: Payer: Self-pay | Admitting: Family Medicine

## 2018-03-11 ENCOUNTER — Ambulatory Visit (INDEPENDENT_AMBULATORY_CARE_PROVIDER_SITE_OTHER): Payer: BLUE CROSS/BLUE SHIELD | Admitting: Family Medicine

## 2018-03-11 VITALS — BP 120/64 | HR 72 | Temp 98.1°F | Ht 66.0 in | Wt 153.1 lb

## 2018-03-11 DIAGNOSIS — Z0000 Encounter for general adult medical examination without abnormal findings: Secondary | ICD-10-CM | POA: Diagnosis not present

## 2018-03-11 NOTE — Progress Notes (Signed)
r 

## 2018-03-11 NOTE — Progress Notes (Signed)
   Subjective:    Patient ID: Tracey Sweeney, female    DOB: 07-Dec-1955, 62 y.o.   MRN: [id number]  HPI Here for a well exam. She feels well. She had seen Dr. Trudie Reed for arthritis pain, particularly in the hands. She took Celebrex for awhile but she was concerned about potential side effects so she stopped it. Since then she has started to wear copper lined gloves and she gets acupuncture to the hands, and she says this really works well. She recently saw Dr. Estill Dooms, who did Xrays and told her she has no evidence of kidney stones.   Review of Systems  Constitutional: Negative.   HENT: Negative.   Eyes: Negative.   Respiratory: Negative.   Cardiovascular: Negative.   Gastrointestinal: Negative.   Genitourinary: Negative for decreased urine volume, difficulty urinating, dyspareunia, dysuria, enuresis, flank pain, frequency, hematuria, pelvic pain and urgency.  Musculoskeletal: Positive for arthralgias.  Skin: Negative.   Neurological: Negative.   Psychiatric/Behavioral: Negative.        Objective:   Physical Exam  Constitutional: She appears well-developed and well-nourished. No distress.  HENT:  Head: Normocephalic and atraumatic.  Right Ear: External ear normal.  Left Ear: External ear normal.  Nose: Nose normal.  Mouth/Throat: Oropharynx is clear and moist. No oropharyngeal exudate.  Eyes: Pupils are equal, round, and reactive to light. Conjunctivae and EOM are normal. Right eye exhibits no discharge. Left eye exhibits no discharge. No scleral icterus.  Neck: Normal range of motion. Neck supple. No JVD present. No thyromegaly present.  Cardiovascular: Normal rate, regular rhythm, normal heart sounds and intact distal pulses. Exam reveals no gallop and no friction rub.  No murmur heard. Pulmonary/Chest: Effort normal and breath sounds normal. No stridor. No respiratory distress. She has no wheezes. She has no rales. She exhibits no tenderness. No breast tenderness, discharge or  bleeding.  Abdominal: Soft. Normal appearance and bowel sounds are normal. She exhibits no distension, no abdominal bruit, no ascites and no mass. There is no hepatosplenomegaly. There is no tenderness. There is no rigidity, no rebound and no guarding. No hernia.  Genitourinary: Rectum normal, vagina normal and uterus normal. No breast tenderness, discharge or bleeding. Cervix exhibits no motion tenderness, no discharge and no friability. Right adnexum displays no mass, no tenderness and no fullness. Left adnexum displays no mass, no tenderness and no fullness. No erythema, tenderness or bleeding in the vagina. No vaginal discharge found.  Musculoskeletal: Normal range of motion. She exhibits no edema or tenderness.  Lymphadenopathy:    She has no cervical adenopathy.  Neurological: She is alert. She has normal reflexes. No cranial nerve deficit. She exhibits normal muscle tone. Coordination normal.  Skin: Skin is warm and dry. No rash noted. She is not diaphoretic. No erythema. No pallor.  Psychiatric: She has a normal mood and affect. Her behavior is normal. Judgment and thought content normal.          Assessment & Plan:  Well exam. We discussed diet and exercise. Get fasting labs.  Alysia Penna, MD

## 2018-03-12 LAB — HEPATIC FUNCTION PANEL
ALT: 15 U/L (ref 0–35)
AST: 20 U/L (ref 0–37)
Albumin: 4.3 g/dL (ref 3.5–5.2)
Alkaline Phosphatase: 71 U/L (ref 39–117)
BILIRUBIN DIRECT: 0.1 mg/dL (ref 0.0–0.3)
BILIRUBIN TOTAL: 0.4 mg/dL (ref 0.2–1.2)
Total Protein: 6.4 g/dL (ref 6.0–8.3)

## 2018-03-12 LAB — POC URINALSYSI DIPSTICK (AUTOMATED)
Bilirubin, UA: NEGATIVE
GLUCOSE UA: NEGATIVE
Ketones, UA: NEGATIVE
Leukocytes, UA: NEGATIVE
NITRITE UA: NEGATIVE
PH UA: 6.5 (ref 5.0–8.0)
Protein, UA: NEGATIVE
RBC UA: NEGATIVE
Spec Grav, UA: 1.02 (ref 1.010–1.025)
UROBILINOGEN UA: 0.2 U/dL

## 2018-03-12 LAB — CBC WITH DIFFERENTIAL/PLATELET
BASOS ABS: 0 10*3/uL (ref 0.0–0.1)
Basophils Relative: 0.9 % (ref 0.0–3.0)
EOS ABS: 0.1 10*3/uL (ref 0.0–0.7)
Eosinophils Relative: 1.5 % (ref 0.0–5.0)
HEMATOCRIT: 40 % (ref 36.0–46.0)
HEMOGLOBIN: 13.7 g/dL (ref 12.0–15.0)
LYMPHS PCT: 39.5 % (ref 12.0–46.0)
Lymphs Abs: 1.9 10*3/uL (ref 0.7–4.0)
MCHC: 34.2 g/dL (ref 30.0–36.0)
MCV: 88.3 fl (ref 78.0–100.0)
Monocytes Absolute: 0.4 10*3/uL (ref 0.1–1.0)
Monocytes Relative: 9 % (ref 3.0–12.0)
NEUTROS ABS: 2.4 10*3/uL (ref 1.4–7.7)
Neutrophils Relative %: 49.1 % (ref 43.0–77.0)
PLATELETS: 209 10*3/uL (ref 150.0–400.0)
RBC: 4.52 Mil/uL (ref 3.87–5.11)
RDW: 13.2 % (ref 11.5–15.5)
WBC: 4.9 10*3/uL (ref 4.0–10.5)

## 2018-03-12 LAB — LIPID PANEL
CHOL/HDL RATIO: 3
Cholesterol: 199 mg/dL (ref 0–200)
HDL: 63.2 mg/dL (ref >39.00)
LDL CALC: 122 mg/dL — AB (ref 0–99)
NONHDL: 136.23
Triglycerides: 69 mg/dL (ref 0.0–149.0)
VLDL: 13.8 mg/dL (ref 0.0–40.0)

## 2018-03-12 LAB — BASIC METABOLIC PANEL
BUN: 12 mg/dL (ref 6–23)
CHLORIDE: 106 meq/L (ref 96–112)
CO2: 28 mEq/L (ref 19–32)
Calcium: 9.3 mg/dL (ref 8.4–10.5)
Creatinine, Ser: 0.65 mg/dL (ref 0.40–1.20)
GFR: 98.62 mL/min (ref >60.00)
GLUCOSE: 92 mg/dL (ref 70–99)
POTASSIUM: 4.4 meq/L (ref 3.5–5.1)
Sodium: 142 mEq/L (ref 135–145)

## 2018-03-12 LAB — TSH: TSH: 1.33 u[IU]/mL (ref 0.35–4.50)

## 2018-03-12 NOTE — Addendum Note (Signed)
Addended by: Elmer Picker on: 03/12/2018 07:55 AM   Modules accepted: Orders

## 2018-03-14 DIAGNOSIS — M9901 Segmental and somatic dysfunction of cervical region: Secondary | ICD-10-CM | POA: Diagnosis not present

## 2018-03-18 ENCOUNTER — Encounter: Payer: Self-pay | Admitting: *Deleted

## 2018-03-21 DIAGNOSIS — M9901 Segmental and somatic dysfunction of cervical region: Secondary | ICD-10-CM | POA: Diagnosis not present

## 2018-04-04 DIAGNOSIS — M9901 Segmental and somatic dysfunction of cervical region: Secondary | ICD-10-CM | POA: Diagnosis not present

## 2018-04-11 DIAGNOSIS — M9901 Segmental and somatic dysfunction of cervical region: Secondary | ICD-10-CM | POA: Diagnosis not present

## 2018-04-17 DIAGNOSIS — M9901 Segmental and somatic dysfunction of cervical region: Secondary | ICD-10-CM | POA: Diagnosis not present

## 2018-04-23 DIAGNOSIS — M9901 Segmental and somatic dysfunction of cervical region: Secondary | ICD-10-CM | POA: Diagnosis not present

## 2018-04-30 DIAGNOSIS — M9901 Segmental and somatic dysfunction of cervical region: Secondary | ICD-10-CM | POA: Diagnosis not present

## 2018-05-09 DIAGNOSIS — M9901 Segmental and somatic dysfunction of cervical region: Secondary | ICD-10-CM | POA: Diagnosis not present

## 2018-05-16 DIAGNOSIS — M9901 Segmental and somatic dysfunction of cervical region: Secondary | ICD-10-CM | POA: Diagnosis not present

## 2018-05-23 DIAGNOSIS — M9901 Segmental and somatic dysfunction of cervical region: Secondary | ICD-10-CM | POA: Diagnosis not present

## 2018-05-30 DIAGNOSIS — M9901 Segmental and somatic dysfunction of cervical region: Secondary | ICD-10-CM | POA: Diagnosis not present

## 2018-06-06 DIAGNOSIS — M9901 Segmental and somatic dysfunction of cervical region: Secondary | ICD-10-CM | POA: Diagnosis not present

## 2018-06-13 DIAGNOSIS — M9901 Segmental and somatic dysfunction of cervical region: Secondary | ICD-10-CM | POA: Diagnosis not present

## 2018-06-20 DIAGNOSIS — M9901 Segmental and somatic dysfunction of cervical region: Secondary | ICD-10-CM | POA: Diagnosis not present

## 2018-06-27 DIAGNOSIS — M9901 Segmental and somatic dysfunction of cervical region: Secondary | ICD-10-CM | POA: Diagnosis not present

## 2018-06-28 ENCOUNTER — Ambulatory Visit: Payer: Self-pay | Admitting: *Deleted

## 2018-06-28 NOTE — Telephone Encounter (Signed)
Please advise 

## 2018-06-28 NOTE — Telephone Encounter (Signed)
These are common reactions to the vaccine. Apply ice to the site and she can drink plenty of fluids

## 2018-06-28 NOTE — Telephone Encounter (Signed)
Summary: advice   Pt states she got the shingrix vaccine yesterday at [facility]. Pt got a red spot , "blotch" beside the injection sight, and it is larger this am than it was last night. Pt states she is exhausted, and wants to know if this is normal? No other symptoms/. Please advise     Spoke with patient- she will use home care advise and call back if she gets worse or her symptoms do not subside within 3 days. Reason for Disposition . Shingles (Herpes zoster; Shingrix) vaccine reactions  Answer Assessment - Initial Assessment Questions 1. SYMPTOMS: "What is the main symptom?" (e.g., redness, swelling, pain)      Fatigue, red blotch to R of injection 2. ONSET: "When was the vaccine (shot) given?" "How much later did the reaction__ begin?" (e.g., hours, days ago)      Noon- fatigue last night-7pm, red area arm- bedtime 10pm 3. SEVERITY: "How bad is it?"      Lack of energy- tired, red area- 1"x1" 4. FEVER: "Is there a fever?" If so, ask: "What is it, how was it measured, and when did it start?"      Patient is cold- but has not checked her temp 5. IMMUNIZATIONS GIVEN: "What shots have you recently received?"     shingrix 6. PAST REACTIONS: "Have you reacted to immunizations before?" If so, ask: "What happened?"     No- reaction to injection at doctors office 7. OTHER SYMPTOMS: "Do you have any other symptoms?"     thirsty  Protocols used: IMMUNIZATION REACTIONS-A-AH

## 2018-06-28 NOTE — Telephone Encounter (Signed)
Called and spoke with pt and she is aware of Dr. Fry's recs.  

## 2018-07-04 DIAGNOSIS — M9901 Segmental and somatic dysfunction of cervical region: Secondary | ICD-10-CM | POA: Diagnosis not present

## 2018-09-09 ENCOUNTER — Encounter: Payer: Self-pay | Admitting: Gastroenterology

## 2018-10-01 ENCOUNTER — Other Ambulatory Visit: Payer: Self-pay | Admitting: Podiatry

## 2018-10-01 ENCOUNTER — Other Ambulatory Visit: Payer: Self-pay

## 2018-10-01 ENCOUNTER — Ambulatory Visit: Payer: BLUE CROSS/BLUE SHIELD | Admitting: Podiatry

## 2018-10-01 ENCOUNTER — Encounter: Payer: Self-pay | Admitting: Podiatry

## 2018-10-01 ENCOUNTER — Ambulatory Visit (INDEPENDENT_AMBULATORY_CARE_PROVIDER_SITE_OTHER): Payer: BC Managed Care – PPO

## 2018-10-01 VITALS — Temp 97.5°F

## 2018-10-01 DIAGNOSIS — M21621 Bunionette of right foot: Secondary | ICD-10-CM

## 2018-10-01 DIAGNOSIS — M7751 Other enthesopathy of right foot: Secondary | ICD-10-CM

## 2018-10-01 DIAGNOSIS — M2041 Other hammer toe(s) (acquired), right foot: Secondary | ICD-10-CM

## 2018-10-01 MED ORDER — TRIAMCINOLONE ACETONIDE 10 MG/ML IJ SUSP: 10.0000 mg | Freq: Once | INTRAMUSCULAR | Status: AC

## 2018-10-10 NOTE — Progress Notes (Signed)
Subjective: 63 year old female presents the office today for concerns of swelling, pain to the outside aspect of the right foot, points to the tailor's bunion.  This is been on the past 1 and half months.  The area is tender with shoes and pressure.  She states that it does throb at times.  No numbness or tingling.  No recent treatment.  No recent injury. Denies any systemic complaints such as fevers, chills, nausea, vomiting. No acute changes since last appointment, and no other complaints at this time.   Objective: AAO x3, NAD DP/PT pulses palpable bilaterally, CRT less than 3 seconds There is still swelling present nurse localized edema and almost a bursitis present to the lateral aspect the fifth metatarsal head.  There is localized erythema more from inflammation and irritation inside shoes.  There is no increase in warmth.  There is no ascending cellulitis.  No other clinical signs of infection are present. No open lesions or pre-ulcerative lesions.  No pain with calf compression, swelling, warmth, erythema  Assessment: 63 year old female with capsulitis right fifth MPJ, bursitis  Plan: -All treatment options discussed with the patient including all alternatives, risks, complications.  -X-rays were obtained and reviewed.  Tailor's bunion, bunion deformities present.  Soft tissue swelling to the fifth metatarsal head. -Steroid injection performed.  With Betadine.  Mixture of 1 cc Kenalog 10, 0.5 cc of Marcaine plain, 0.5 cc of lidocaine plain was infiltrated into and around the fifth MPJ as well as into the soft tissue mass area without complications.  Postinjection care was discussed.  She tolerated well. -Discussed offloading, shoe modifications. -Patient encouraged to call the office with any questions, concerns, change in symptoms.   Trula Slade DPM

## 2018-11-04 ENCOUNTER — Encounter: Payer: Self-pay | Admitting: Family Medicine

## 2018-11-04 ENCOUNTER — Other Ambulatory Visit: Payer: Self-pay

## 2018-11-04 ENCOUNTER — Ambulatory Visit (INDEPENDENT_AMBULATORY_CARE_PROVIDER_SITE_OTHER): Payer: BC Managed Care – PPO | Admitting: Family Medicine

## 2018-11-04 DIAGNOSIS — R1010 Upper abdominal pain, unspecified: Secondary | ICD-10-CM | POA: Diagnosis not present

## 2018-11-04 DIAGNOSIS — Z0000 Encounter for general adult medical examination without abnormal findings: Secondary | ICD-10-CM

## 2018-11-04 MED ORDER — OMEPRAZOLE 20 MG PO CPDR: 20.0000 mg | DELAYED_RELEASE_CAPSULE | Freq: Every day | ORAL | 3 refills | Status: DC

## 2018-11-04 NOTE — Progress Notes (Signed)
Subjective:    Patient ID: Tracey Sweeney, female    DOB: Sep 09, 1955, 63 y.o.   MRN: [id number]  HPI Virtual Visit via Video Note  I connected with the patient on 11/04/18 at 11:15 AM EDT by a video enabled telemedicine application and verified that I am speaking with the correct person using two identifiers.  Location patient: home Location provider:work or home office Persons participating in the virtual visit: patient, provider  I discussed the limitations of evaluation and management by telemedicine and the availability of in person appointments. The patient expressed understanding and agreed to proceed.   HPI: Here for an upper abdominal pain that started about 2 months ago. This usually triggered by eating. The discomfort is mild. TUMS helps briefly. No trouble swallowing. No nausea. Her appetite is normal. BMs are regular.    ROS: See pertinent positives and negatives per HPI.  Past Medical History:  Diagnosis Date  . Allergy   . GERD (gastroesophageal reflux disease)   . Insomnia   . Kidney stones dec 2014   sees Dr. Aletha Halim with [facility]   . Varicose veins of legs     Past Surgical History:  Procedure Laterality Date  . cervix laser surgery    . COLONOSCOPY  09-03-13   per Dr. Oretha Caprice, adenomatous polyps, repeat in 5 yrs   . LITHOTRIPSY  2014  . TONSILLECTOMY    . varicose veins     Dr. Eilleen Kempf    Family History  Problem Relation Age of Onset  . Cancer Unknown        skin, bladder, & breast  . Arthritis Unknown   . Diabetes Unknown   . Hyperlipidemia Unknown   . Hypertension Unknown   . Kidney disease Unknown   . Bladder Cancer Father   . Melanoma Father   . Melanoma Paternal Aunt   . Colon cancer Neg Hx      Current Outpatient Medications:  Marland Kitchen  Guaifenesin 1200 MG TB12, Take 1 tablet by mouth at bedtime., Disp: , Rfl:  .  Levocetirizine Dihydrochloride (XYZAL PO), Take by mouth., Disp: , Rfl:  .  Omega-3 Fatty Acids (FISH OIL) 1200 MG CAPS,  Take by mouth daily., Disp: , Rfl:  .  omeprazole (PRILOSEC) 20 MG capsule, Take 1 capsule (20 mg total) by mouth daily., Disp: 30 capsule, Rfl: 3  Current Facility-Administered Medications:  .  triamcinolone acetonide (KENALOG) 10 MG/ML injection 10 mg, 10 mg, Other, Once, Wagoner, Bonna Gains, DPM  EXAM:  VITALS per patient if applicable:  GENERAL: alert, oriented, appears well and in no acute distress  HEENT: atraumatic, conjunttiva clear, no obvious abnormalities on inspection of external nose and ears  NECK: normal movements of the head and neck  LUNGS: on inspection no signs of respiratory distress, breathing rate appears normal, no obvious gross SOB, gasping or wheezing  CV: no obvious cyanosis  MS: moves all visible extremities without noticeable abnormality  PSYCH/NEURO: pleasant and cooperative, no obvious depression or anxiety, speech and thought processing grossly intact  ASSESSMENT AND PLAN: Upper abdominal pain, possible gastritis. Try Prilosec 20 mg daily.  Alysia Penna, MD  Discussed the following assessment and plan:  Preventative health care - Plan: Ambulatory referral to Gastroenterology     I discussed the assessment and treatment plan with the patient. The patient was provided an opportunity to ask questions and all were answered. The patient agreed with the plan and demonstrated an understanding of the instructions.  The patient was advised to call back or seek an in-person evaluation if the symptoms worsen or if the condition fails to improve as anticipated.     Review of Systems     Objective:   Physical Exam        Assessment & Plan:

## 2018-11-18 ENCOUNTER — Telehealth: Payer: Self-pay

## 2018-11-18 NOTE — Telephone Encounter (Signed)
Copied from [place] [id number]. Topic: General - Inquiry >> Nov 18, 2018 10:34 AM Scherrie Gerlach wrote: Reason for CRM: pt states she has a very painful UTI.  Been going on since last Thursday.  Lower back pain, low grade fever, burning w/ urination, frequency , and she is miserable. Pt would like to be seen asap, or a UA order, or could he call in abx asap?

## 2018-11-18 NOTE — Telephone Encounter (Signed)
Please advise. Does the patient need an appointment?

## 2018-11-19 NOTE — Telephone Encounter (Signed)
Pt called in and stated no one called her back and she wants to know if she can come in for a urinalysis or something to treat her UTI/ please advise

## 2018-11-19 NOTE — Telephone Encounter (Signed)
Make a Doxy visit for this

## 2018-11-20 ENCOUNTER — Ambulatory Visit (INDEPENDENT_AMBULATORY_CARE_PROVIDER_SITE_OTHER): Payer: BC Managed Care – PPO | Admitting: Family Medicine

## 2018-11-20 ENCOUNTER — Encounter: Payer: Self-pay | Admitting: Family Medicine

## 2018-11-20 ENCOUNTER — Other Ambulatory Visit: Payer: Self-pay

## 2018-11-20 DIAGNOSIS — N390 Urinary tract infection, site not specified: Secondary | ICD-10-CM | POA: Diagnosis not present

## 2018-11-20 MED ORDER — CIPROFLOXACIN HCL 500 MG PO TABS: 500.0000 mg | ORAL_TABLET | Freq: Two times a day (BID) | ORAL | 0 refills | Status: DC

## 2018-11-20 NOTE — Progress Notes (Signed)
Subjective:    Patient ID: Tracey Sweeney, female    DOB: 09/16/1955, 63 y.o.   MRN: [id number]  HPI Virtual Visit via Video Note  I connected with the patient on 11/20/18 at  9:30 AM EDT by a video enabled telemedicine application and verified that I am speaking with the correct person using two identifiers.  Location patient: home Location provider:work or home office Persons participating in the virtual visit: patient, provider  I discussed the limitations of evaluation and management by telemedicine and the availability of in person appointments. The patient expressed understanding and agreed to proceed.   HPI: Here for 5 days of urinary urgency, back pain, burning, and foul smelling urine. No fever. Drinking lots of water.    ROS: See pertinent positives and negatives per HPI.  Past Medical History:  Diagnosis Date  . Allergy   . GERD (gastroesophageal reflux disease)   . Insomnia   . Kidney stones dec 2014   sees Dr. Aletha Halim with [facility]   . Varicose veins of legs     Past Surgical History:  Procedure Laterality Date  . cervix laser surgery    . COLONOSCOPY  09-03-13   per Dr. Oretha Caprice, adenomatous polyps, repeat in 5 yrs   . LITHOTRIPSY  2014  . TONSILLECTOMY    . varicose veins     Dr. Eilleen Kempf    Family History  Problem Relation Age of Onset  . Cancer Unknown        skin, bladder, & breast  . Arthritis Unknown   . Diabetes Unknown   . Hyperlipidemia Unknown   . Hypertension Unknown   . Kidney disease Unknown   . Bladder Cancer Father   . Melanoma Father   . Melanoma Paternal Aunt   . Colon cancer Neg Hx      Current Outpatient Medications:  .  ciprofloxacin (CIPRO) 500 MG tablet, Take 1 tablet (500 mg total) by mouth 2 (two) times daily., Disp: 14 tablet, Rfl: 0 .  Guaifenesin 1200 MG TB12, Take 1 tablet by mouth at bedtime., Disp: , Rfl:  .  Levocetirizine Dihydrochloride (XYZAL PO), Take by mouth., Disp: , Rfl:  .  Omega-3 Fatty Acids  (FISH OIL) 1200 MG CAPS, Take by mouth daily., Disp: , Rfl:  .  omeprazole (PRILOSEC) 20 MG capsule, Take 1 capsule (20 mg total) by mouth daily., Disp: 30 capsule, Rfl: 3  Current Facility-Administered Medications:  .  triamcinolone acetonide (KENALOG) 10 MG/ML injection 10 mg, 10 mg, Other, Once, Wagoner, Bonna Gains, DPM  EXAM:  VITALS per patient if applicable:  GENERAL: alert, oriented, appears well and in no acute distress  HEENT: atraumatic, conjunttiva clear, no obvious abnormalities on inspection of external nose and ears  NECK: normal movements of the head and neck  LUNGS: on inspection no signs of respiratory distress, breathing rate appears normal, no obvious gross SOB, gasping or wheezing  CV: no obvious cyanosis  MS: moves all visible extremities without noticeable abnormality  PSYCH/NEURO: pleasant and cooperative, no obvious depression or anxiety, speech and thought processing grossly intact  ASSESSMENT AND PLAN: UTI, treat with Cipro.  Alysia Penna, MD  Discussed the following assessment and plan:  No diagnosis found.     I discussed the assessment and treatment plan with the patient. The patient was provided an opportunity to ask questions and all were answered. The patient agreed with the plan and demonstrated an understanding of the instructions.   The patient was  advised to call back or seek an in-person evaluation if the symptoms worsen or if the condition fails to improve as anticipated.     Review of Systems     Objective:   Physical Exam        Assessment & Plan:

## 2018-11-20 NOTE — Telephone Encounter (Signed)
appointment scheduled

## 2018-12-02 ENCOUNTER — Ambulatory Visit: Payer: BC Managed Care – PPO

## 2018-12-02 ENCOUNTER — Other Ambulatory Visit: Payer: Self-pay

## 2018-12-02 VITALS — Ht 66.0 in | Wt 148.5 lb

## 2018-12-02 DIAGNOSIS — Z800 Family history of malignant neoplasm of digestive organs: Secondary | ICD-10-CM

## 2018-12-02 DIAGNOSIS — Z86010 Personal history of colonic polyps: Secondary | ICD-10-CM

## 2018-12-02 MED ORDER — PEG 3350-KCL-NA BICARB-NACL 420 G PO SOLR: 4000.0000 mL | Freq: Once | ORAL | 0 refills | Status: AC

## 2018-12-02 NOTE — Progress Notes (Signed)
Per pt, no allergies to soy or egg products.Pt not taking any weight loss meds or using  O2 at home. Pt refused emmi video. Pt denies sedation problem.    The PV was done over the phone due to COVID-19. Verified pt's address and insurance. Reviewed prep instructions and medical hx with pt and will mail paperwork to the pt today. Informed pt to call with any questions or changes prior to her procedure. She understood.

## 2018-12-13 ENCOUNTER — Telehealth: Payer: Self-pay | Admitting: Gastroenterology

## 2018-12-13 NOTE — Telephone Encounter (Signed)
Left message to call back to ask Covid-19 screening questions.  Covid-19 Screening Questions: Do you now or have you had a fever in the last 14 days?  Do you have any respiratory symptoms of shortness of breath or cough now or in the last 14 days?  Do you have any family members or close contacts with diagnosed or suspected Covid-19 in the past 14 days?  Have you been tested for Covid-19 and found to be positive?   Pls make pt aware of that care partner may wait in the car or come up to the lobby during the procedure but will need to provide their own mask.

## 2018-12-13 NOTE — Telephone Encounter (Signed)
Pt responded "no" to all screening questions °

## 2018-12-16 ENCOUNTER — Encounter: Payer: Self-pay | Admitting: Gastroenterology

## 2018-12-16 ENCOUNTER — Ambulatory Visit (AMBULATORY_SURGERY_CENTER): Payer: BC Managed Care – PPO | Admitting: Gastroenterology

## 2018-12-16 ENCOUNTER — Other Ambulatory Visit: Payer: Self-pay

## 2018-12-16 VITALS — BP 110/56 | HR 62 | Temp 97.5°F | Resp 13 | Ht 66.0 in | Wt 148.0 lb

## 2018-12-16 DIAGNOSIS — D124 Benign neoplasm of descending colon: Secondary | ICD-10-CM

## 2018-12-16 DIAGNOSIS — Z800 Family history of malignant neoplasm of digestive organs: Secondary | ICD-10-CM

## 2018-12-16 DIAGNOSIS — Z86010 Personal history of colonic polyps: Secondary | ICD-10-CM

## 2018-12-16 DIAGNOSIS — Z1211 Encounter for screening for malignant neoplasm of colon: Secondary | ICD-10-CM | POA: Diagnosis not present

## 2018-12-16 HISTORY — PX: COLONOSCOPY: SHX174

## 2018-12-16 MED ORDER — SODIUM CHLORIDE 0.9 % IV SOLN: 500.0000 mL | Freq: Once | INTRAVENOUS | Status: DC

## 2018-12-16 NOTE — Progress Notes (Signed)
Called to room to assist during endoscopic procedure.  Patient ID and intended procedure confirmed with present staff. Received instructions for my participation in the procedure from the performing physician.  

## 2018-12-16 NOTE — Progress Notes (Signed)
Report given to PACU, vss 

## 2018-12-16 NOTE — Patient Instructions (Signed)
Read all handouts given to you by your recovery room nurse.  Thank-you for choosing us for your healthcare needs today.  YOU HAD AN ENDOSCOPIC PROCEDURE TODAY AT THE [facility]:   Refer to the procedure report that was given to you for any specific questions about what was found during the examination.  If the procedure report does not answer your questions, please call your gastroenterologist to clarify.  If you requested that your care partner not be given the details of your procedure findings, then the procedure report has been included in a sealed envelope for you to review at your convenience later.  YOU SHOULD EXPECT: Some feelings of bloating in the abdomen. Passage of more gas than usual.  Walking can help get rid of the air that was put into your GI tract during the procedure and reduce the bloating. If you had a lower endoscopy (such as a colonoscopy or flexible sigmoidoscopy) you may notice spotting of blood in your stool or on the toilet paper. If you underwent a bowel prep for your procedure, you may not have a normal bowel movement for a few days.  Please Note:  You might notice some irritation and congestion in your nose or some drainage.  This is from the oxygen used during your procedure.  There is no need for concern and it should clear up in a day or so.  SYMPTOMS TO REPORT IMMEDIATELY:   Following lower endoscopy (colonoscopy or flexible sigmoidoscopy):  Excessive amounts of blood in the stool  Significant tenderness or worsening of abdominal pains  Swelling of the abdomen that is new, acute  Fever of 100F or higher   For urgent or emergent issues, a gastroenterologist can be reached at any hour by calling [phone].   DIET:  We do recommend a small meal at first, but then you may proceed to your regular diet.  Drink plenty of fluids but you should avoid alcoholic beverages for 24 hours.  ACTIVITY:  You should plan to take it easy for the rest of today  and you should NOT DRIVE or use heavy machinery until tomorrow (because of the sedation medicines used during the test).    FOLLOW UP: Our staff will call the number listed on your records 48-72 hours following your procedure to check on you and address any questions or concerns that you may have regarding the information given to you following your procedure. If we do not reach you, we will leave a message.  We will attempt to reach you two times.  During this call, we will ask if you have developed any symptoms of COVID 19. If you develop any symptoms (ie: fever, flu-like symptoms, shortness of breath, cough etc.) before then, please call [phone].  If you test positive for Covid 19 in the 2 weeks post procedure, please call and report this information to us.    If any biopsies were taken you will be contacted by phone or by letter within the next 1-3 weeks.  Please call us at [phone] if you have not heard about the biopsies in 3 weeks.    SIGNATURES/CONFIDENTIALITY: You and/or your care partner have signed paperwork which will be entered into your electronic medical record.  These signatures attest to the fact that that the information above on your After Visit Summary has been reviewed and is understood.  Full responsibility of the confidentiality of this discharge information lies with you and/or your care-partner. 

## 2018-12-16 NOTE — Op Note (Signed)
[facility] Patient Name: Tracey Sweeney Procedure Date: 12/16/2018 10:38 AM MRN: [id number] Endoscopist: Milus Banister , MD Age: 63 Referring MD:  Date of Birth: 10/16/1955 Gender: Female Account #: [id number] Procedure:                Colonoscopy Indications:              High risk colon cancer surveillance: Personal                            history of colonic polyps; colonoscopy 2009 single                            subCM adenoma, colonoscopy 2015 two subCM adenomas,                            also sister with colon cancer diagnosed in her 30s Medicines:                Monitored Anesthesia Care Procedure:                Pre-Anesthesia Assessment:                           - Prior to the procedure, a History and Physical                            was performed, and patient medications and                            allergies were reviewed. The patient's tolerance of                            previous anesthesia was also reviewed. The risks                            and benefits of the procedure and the sedation                            options and risks were discussed with the patient.                            All questions were answered, and informed consent                            was obtained. Prior Anticoagulants: The patient has                            taken no previous anticoagulant or antiplatelet                            agents. ASA Grade Assessment: II - A patient with                            mild systemic disease. After reviewing the risks  and benefits, the patient was deemed in                            satisfactory condition to undergo the procedure.                           After obtaining informed consent, the colonoscope                            was passed under direct vision. Throughout the                            procedure, the patient's blood pressure, pulse, and                            oxygen  saturations were monitored continuously. The                            Colonoscope was introduced through the anus and                            advanced to the the cecum, identified by                            appendiceal orifice and ileocecal valve. The                            colonoscopy was performed without difficulty. The                            patient tolerated the procedure well. The quality                            of the bowel preparation was good. The ileocecal                            valve, appendiceal orifice, and rectum were                            photographed. Scope In: 10:44:04 AM Scope Out: 10:56:50 AM Scope Withdrawal Time: 0 hours 8 minutes 44 seconds  Total Procedure Duration: 0 hours 12 minutes 46 seconds  Findings:                 Three sessile polyps were found in the descending                            colon. The polyps were 2 to 3 mm in size. These                            polyps were removed with a cold snare. Resection                            and retrieval were complete.  The exam was otherwise without abnormality on                            direct and retroflexion views. Complications:            No immediate complications. Estimated blood loss:                            None. Estimated Blood Loss:     Estimated blood loss: none. Impression:               - Three 2 to 3 mm polyps in the descending colon,                            removed with a cold snare. Resected and retrieved.                           - The examination was otherwise normal on direct                            and retroflexion views. Recommendation:           - Patient has a contact number available for                            emergencies. The signs and symptoms of potential                            delayed complications were discussed with the                            patient. Return to normal activities tomorrow.                             Written discharge instructions were provided to the                            patient.                           - Resume previous diet.                           - Continue present medications.                           - Repeat colonoscopy is recommended. The                            colonoscopy date will be determined after pathology                            results from today's exam become available for                            review. Likely 3-5 years. Milus Banister, MD 12/16/2018 11:00:14 AM  This report has been signed electronically.

## 2018-12-16 NOTE — Progress Notes (Signed)
Pt's states no medical or surgical changes since previsit or office visit.  Temp per June VS per Courtney 

## 2018-12-17 ENCOUNTER — Ambulatory Visit: Payer: Self-pay | Admitting: Gastroenterology

## 2018-12-18 ENCOUNTER — Telehealth: Payer: Self-pay

## 2018-12-18 ENCOUNTER — Encounter: Payer: Self-pay | Admitting: Gastroenterology

## 2018-12-18 NOTE — Telephone Encounter (Signed)
Called [phone] and left a messaged we tried to reach pt for a follow up call. maw

## 2018-12-18 NOTE — Telephone Encounter (Signed)
  Follow up Call-  Call back number 12/16/2018  Post procedure Call Back phone  # [phone]  Permission to leave phone message Yes  Some recent data might be hidden     Patient questions:  Do you have a fever, pain , or abdominal swelling? No. Pain Score  0 *  Have you tolerated food without any problems? Yes.    Have you been able to return to your normal activities? Yes.    Do you have any questions about your discharge instructions: Diet   No. Medications  No. Follow up visit  No.  Do you have questions or concerns about your Care? No.  Actions: * If pain score is 4 or above: No action needed, pain <4.  1. Have you developed a fever since your procedure? no  2.   Have you had an respiratory symptoms (SOB or cough) since your procedure? no  3.   Have you tested positive for COVID 19 since your procedure no  4.   Have you had any family members/close contacts diagnosed with the COVID 19 since your procedure?  no   If yes to any of these questions please route to Joylene John, RN and Alphonsa Gin, Therapist, sports.

## 2019-01-02 ENCOUNTER — Telehealth: Payer: Self-pay | Admitting: Gastroenterology

## 2019-01-02 NOTE — Telephone Encounter (Signed)
The pt has been advised and letter sent to her via My Chart

## 2019-01-02 NOTE — Telephone Encounter (Signed)
[facility] [address] [place], [place]  [address] Phone:  [phone]   Fax:  [phone]  December 18, 2018   Analeya C Babe [address] [place]  [address]   Dear Ms. Levi Aland,  Two of the polyps which I removed during your recent procedure were proven to be completely benign but are considered "pre-cancerous" polyps that MAY have grown into cancer if they had not been removed.  Studies shows that at least 20% of women over age 61 and 30% of men over age 74 have pre-cancerous polyps.  Based on current nationally recognized surveillance guidelines and given the fact that your sister had colon cancer, I recommend that you have a repeat colonoscopy in 5 years.   If you develop any new rectal bleeding, abdominal pain or significant bowel habit changes, please contact me before then.    Sincerely,    Milus Banister, MD

## 2019-01-09 DIAGNOSIS — L820 Inflamed seborrheic keratosis: Secondary | ICD-10-CM | POA: Diagnosis not present

## 2019-01-09 DIAGNOSIS — L821 Other seborrheic keratosis: Secondary | ICD-10-CM | POA: Diagnosis not present

## 2019-01-09 DIAGNOSIS — D2362 Other benign neoplasm of skin of left upper limb, including shoulder: Secondary | ICD-10-CM | POA: Diagnosis not present

## 2019-01-17 ENCOUNTER — Encounter: Payer: Self-pay | Admitting: Family Medicine

## 2019-01-17 DIAGNOSIS — Z1231 Encounter for screening mammogram for malignant neoplasm of breast: Secondary | ICD-10-CM | POA: Diagnosis not present

## 2019-01-17 DIAGNOSIS — Z803 Family history of malignant neoplasm of breast: Secondary | ICD-10-CM | POA: Diagnosis not present

## 2019-07-25 DIAGNOSIS — Z87442 Personal history of urinary calculi: Secondary | ICD-10-CM | POA: Diagnosis not present

## 2019-10-17 ENCOUNTER — Ambulatory Visit (INDEPENDENT_AMBULATORY_CARE_PROVIDER_SITE_OTHER): Payer: BC Managed Care – PPO | Admitting: Family Medicine

## 2019-10-17 ENCOUNTER — Other Ambulatory Visit: Payer: Self-pay

## 2019-10-17 ENCOUNTER — Encounter: Payer: Self-pay | Admitting: Family Medicine

## 2019-10-17 VITALS — BP 130/70 | HR 92 | Temp 98.5°F | Ht 66.0 in | Wt 155.0 lb

## 2019-10-17 DIAGNOSIS — Z0000 Encounter for general adult medical examination without abnormal findings: Secondary | ICD-10-CM | POA: Diagnosis not present

## 2019-10-17 LAB — BASIC METABOLIC PANEL
BUN: 14 mg/dL (ref 6–23)
CO2: 28 mEq/L (ref 19–32)
Calcium: 9.7 mg/dL (ref 8.4–10.5)
Chloride: 107 mEq/L (ref 96–112)
Creatinine, Ser: 0.69 mg/dL (ref 0.40–1.20)
GFR: 86.15 mL/min (ref >60.00)
Glucose, Bld: 93 mg/dL (ref 70–99)
Potassium: 4.2 mEq/L (ref 3.5–5.1)
Sodium: 140 mEq/L (ref 135–145)

## 2019-10-17 LAB — CBC WITH DIFFERENTIAL/PLATELET
Basophils Absolute: 0.1 10*3/uL (ref 0.0–0.1)
Basophils Relative: 1.1 % (ref 0.0–3.0)
Eosinophils Absolute: 0 10*3/uL (ref 0.0–0.7)
Eosinophils Relative: 0.9 % (ref 0.0–5.0)
HCT: 40 % (ref 36.0–46.0)
Hemoglobin: 13.9 g/dL (ref 12.0–15.0)
Lymphocytes Relative: 38.8 % (ref 12.0–46.0)
Lymphs Abs: 1.9 10*3/uL (ref 0.7–4.0)
MCHC: 34.8 g/dL (ref 30.0–36.0)
MCV: 89 fl (ref 78.0–100.0)
Monocytes Absolute: 0.4 10*3/uL (ref 0.1–1.0)
Monocytes Relative: 8.2 % (ref 3.0–12.0)
Neutro Abs: 2.5 10*3/uL (ref 1.4–7.7)
Neutrophils Relative %: 51 % (ref 43.0–77.0)
Platelets: 205 10*3/uL (ref 150.0–400.0)
RBC: 4.49 Mil/uL (ref 3.87–5.11)
RDW: 13.7 % (ref 11.5–15.5)
WBC: 4.8 10*3/uL (ref 4.0–10.5)

## 2019-10-17 LAB — HEPATIC FUNCTION PANEL
ALT: 16 U/L (ref 0–35)
AST: 20 U/L (ref 0–37)
Albumin: 4.6 g/dL (ref 3.5–5.2)
Alkaline Phosphatase: 73 U/L (ref 39–117)
Bilirubin, Direct: 0.1 mg/dL (ref 0.0–0.3)
Total Bilirubin: 0.5 mg/dL (ref 0.2–1.2)
Total Protein: 6.7 g/dL (ref 6.0–8.3)

## 2019-10-17 LAB — LIPID PANEL
Cholesterol: 226 mg/dL — ABNORMAL HIGH (ref 0–200)
HDL: 76 mg/dL (ref >39.00)
LDL Cholesterol: 139 mg/dL — ABNORMAL HIGH (ref 0–99)
NonHDL: 150.48
Total CHOL/HDL Ratio: 3
Triglycerides: 59 mg/dL (ref 0.0–149.0)
VLDL: 11.8 mg/dL (ref 0.0–40.0)

## 2019-10-17 LAB — T4, FREE: Free T4: 0.84 ng/dL (ref 0.60–1.60)

## 2019-10-17 LAB — T3, FREE: T3, Free: 3.4 pg/mL (ref 2.3–4.2)

## 2019-10-17 LAB — TSH: TSH: 0.9 u[IU]/mL (ref 0.35–4.50)

## 2019-10-17 NOTE — Progress Notes (Signed)
Subjective:    Patient ID: Tracey Sweeney, female    DOB: [birth year], 64 y.o.   MRN: [id number]  HPI Here for a well exam. She has a few questions. First about 3 months ago she began to feel a mild dull aching pain in the left upper back that comes and goes. There is no cough or SOB. No hx of trauma. She cannot make the pain worse by moving or deep breathing. Taking one aspirin makes it go away. Also about 6 weeks ago she had an episode which started just as she stood up from her desk (she had been working on her computer for several hours). When she stood up she suddenly felt lightheaded like she might pass out, but she did not. She became very nauseated and she even threw up once. She felt cold and clammy for a few minutes. There was no chest pain or SOB or palpitations. This all resolved after 15 minutes and then she felt back to normal. This has not happened at all since then.    Review of Systems  Constitutional: Negative.   HENT: Negative.   Eyes: Negative.   Respiratory: Negative.   Cardiovascular: Negative.   Gastrointestinal: Negative.   Genitourinary: Negative for decreased urine volume, difficulty urinating, dyspareunia, dysuria, enuresis, flank pain, frequency, hematuria, pelvic pain and urgency.  Musculoskeletal: Positive for back pain.  Skin: Negative.   Neurological: Negative.   Psychiatric/Behavioral: Negative.        Objective:   Physical Exam Constitutional:      General: She is not in acute distress.    Appearance: Normal appearance. She is well-developed.  HENT:     Head: Normocephalic and atraumatic.     Right Ear: External ear normal.     Left Ear: External ear normal.     Nose: Nose normal.     Mouth/Throat:     Pharynx: No oropharyngeal exudate.  Eyes:     General: No scleral icterus.    Conjunctiva/sclera: Conjunctivae normal.     Pupils: Pupils are equal, round, and reactive to light.  Neck:     Thyroid: No thyromegaly.     Vascular: No JVD.    Cardiovascular:     Rate and Rhythm: Normal rate and regular rhythm.     Heart sounds: Normal heart sounds. No murmur heard.  No friction rub. No gallop.   Pulmonary:     Effort: Pulmonary effort is normal. No respiratory distress.     Breath sounds: Normal breath sounds. No wheezing or rales.  Chest:     Chest wall: No tenderness.  Abdominal:     General: Bowel sounds are normal. There is no distension.     Palpations: Abdomen is soft. There is no mass.     Tenderness: There is no abdominal tenderness. There is no guarding or rebound.  Musculoskeletal:        General: No tenderness. Normal range of motion.     Cervical back: Normal range of motion and neck supple.     Comments: Mildly tender in the left upper back, just to the left of the thoracic spine. Full ROM   Lymphadenopathy:     Cervical: No cervical adenopathy.  Skin:    General: Skin is warm and dry.     Findings: No erythema or rash.  Neurological:     Mental Status: She is alert and oriented to person, place, and time.     Cranial Nerves: No cranial nerve deficit.  Motor: No abnormal muscle tone.     Coordination: Coordination normal.     Deep Tendon Reflexes: Reflexes are normal and symmetric. Reflexes normal.  Psychiatric:        Behavior: Behavior normal.        Thought Content: Thought content normal.        Judgment: Judgment normal.           Assessment & Plan:  Well exam. We discussed diet and exercise. Get fasting labs. She seems to be having some muscle spasms in the upper back. She will try heat and will get a massage. Recheck as needed. The episode with near syncope sounds like a vasovagal episode. Hopefully this was a one type event. She will let us know if it happens again.  Alysia Penna, MD

## 2020-01-23 ENCOUNTER — Encounter: Payer: Self-pay | Admitting: Family Medicine

## 2020-01-23 DIAGNOSIS — Z1231 Encounter for screening mammogram for malignant neoplasm of breast: Secondary | ICD-10-CM | POA: Diagnosis not present

## 2020-02-06 ENCOUNTER — Encounter: Payer: Self-pay | Admitting: Family Medicine

## 2020-02-06 DIAGNOSIS — R928 Other abnormal and inconclusive findings on diagnostic imaging of breast: Secondary | ICD-10-CM | POA: Diagnosis not present

## 2020-02-06 DIAGNOSIS — N6002 Solitary cyst of left breast: Secondary | ICD-10-CM | POA: Diagnosis not present

## 2020-02-25 ENCOUNTER — Other Ambulatory Visit: Payer: Self-pay | Admitting: Radiology

## 2020-02-25 ENCOUNTER — Encounter: Payer: Self-pay | Admitting: Family Medicine

## 2020-02-25 DIAGNOSIS — N6322 Unspecified lump in the left breast, upper inner quadrant: Secondary | ICD-10-CM | POA: Diagnosis not present

## 2020-04-13 ENCOUNTER — Encounter: Payer: Self-pay | Admitting: Adult Health

## 2020-04-13 ENCOUNTER — Other Ambulatory Visit: Payer: Self-pay

## 2020-04-13 ENCOUNTER — Ambulatory Visit (INDEPENDENT_AMBULATORY_CARE_PROVIDER_SITE_OTHER): Payer: BC Managed Care – PPO | Admitting: Adult Health

## 2020-04-13 VITALS — BP 144/76 | HR 98 | Temp 98.4°F | Ht 66.0 in | Wt 153.0 lb

## 2020-04-13 DIAGNOSIS — R002 Palpitations: Secondary | ICD-10-CM | POA: Diagnosis not present

## 2020-04-13 NOTE — Progress Notes (Signed)
Subjective:    Patient ID: Tracey Sweeney, female    DOB: [birth year], 64 y.o.   MRN: [id number]  HPI  64 year old female who  has a past medical history of Allergy, Arthritis, Cataract, GERD (gastroesophageal reflux disease), Insomnia, Kidney stones (dec 2014), and Varicose veins of legs.  She presents to the office today for an acute issue of palpitations. Per patient report last week she had a few single episodes of palpitations and a single episode of a " flutter sensation" all lasted just a few seconds. She reports having significant family history of cardiac disease. She was at rest during these episodes.   She denies chest pain or shortness of breath.   She has a single cup of coffee in the morning, does not eat a lot of sodium and no spicy food before these events. She eats pretty healthy   Does report that her sister is sick and this may be causing stress.   Has not had any symptoms since   Labs from 09/2019 showed normal TSH and no signs of anemia   Review of Systems See HPI   Past Medical History:  Diagnosis Date  . Allergy   . Arthritis    has an autoimmune disease  . Cataract    Bil surgery  . GERD (gastroesophageal reflux disease)   . Insomnia   . Kidney stones dec 2014   sees Dr. Aletha Halim with [facility]   . Varicose veins of legs     Social History   Socioeconomic History  . Marital status: Single    Spouse name: Not on file  . Number of children: Not on file  . Years of education: Not on file  . Highest education level: Not on file  Occupational History  . Not on file  Tobacco Use  . Smoking status: Never Smoker  . Smokeless tobacco: Never Used  Substance and Sexual Activity  . Alcohol use: No    Alcohol/week: 0.0 standard drinks  . Drug use: No  . Sexual activity: Not on file  Other Topics Concern  . Not on file  Social History Narrative  . Not on file   Social Determinants of Health   Financial Resource Strain: Not on file  Food  Insecurity: Not on file  Transportation Needs: Not on file  Physical Activity: Not on file  Stress: Not on file  Social Connections: Not on file  Intimate Partner Violence: Not on file    Past Surgical History:  Procedure Laterality Date  . CATARACT EXTRACTION     Bil  . cervix laser surgery    . COLONOSCOPY  12/16/2018   per Dr. Oretha Caprice, adenomatous polyps, repeat in 5 yrs   . KIDNEY STONE SURGERY     had 2 stents put in  . LITHOTRIPSY  2014  . POLYPECTOMY    . TONSILLECTOMY    . varicose veins     Dr. Eilleen Kempf    Family History  Problem Relation Age of Onset  . Cancer Other        skin, bladder, & breast  . Arthritis Other   . Diabetes Other   . Hyperlipidemia Other   . Hypertension Other   . Kidney disease Other   . Bladder Cancer Father   . Melanoma Father   . Diabetes Father   . Melanoma Paternal Aunt   . Heart disease Mother   . Hypertension Mother   . Colon cancer Sister   .  Emphysema Brother   . Colon cancer Cousin     Allergies  Allergen Reactions  . Etodolac     Made stomach bleed  . Diclofenac Sodium Other (See Comments)    Pt unsure of reaction  . Penicillins     REACTION: Hives  . Sulfonamide Derivatives     REACTION: Nausea/vomiting  . Tetracyclines & Related     Syncope     Current Outpatient Medications on File Prior to Visit  Medication Sig Dispense Refill  . Guaifenesin 1200 MG TB12 Take 1 tablet by mouth 2 (two) times daily.     . Levocetirizine Dihydrochloride (XYZAL PO) Take by mouth.    . Omega-3 Fatty Acids (FISH OIL) 1200 MG CAPS Take by mouth daily.    Marland Kitchen omeprazole (PRILOSEC) 20 MG capsule Take 1 capsule (20 mg total) by mouth daily. 30 capsule 3   Current Facility-Administered Medications on File Prior to Visit  Medication Dose Route Frequency Provider Last Rate Last Admin  . triamcinolone acetonide (KENALOG) 10 MG/ML injection 10 mg  10 mg Other Once Trula Slade, DPM        BP (!) 144/76   Pulse 98   Temp 98.4  F (36.9 C) (Oral)   Ht 5\' 6"  (1.676 m)   Wt 153 lb (69.4 kg)   SpO2 99%   BMI 24.69 kg/m       Objective:   Physical Exam Vitals and nursing note reviewed.  Constitutional:      Appearance: Normal appearance.  Eyes:     Extraocular Movements: Extraocular movements intact.     Pupils: Pupils are equal, round, and reactive to light.  Cardiovascular:     Rate and Rhythm: Normal rate and regular rhythm.     Pulses: Normal pulses.     Heart sounds: Normal heart sounds.  Pulmonary:     Effort: Pulmonary effort is normal.  Musculoskeletal:        General: Normal range of motion.  Skin:    General: Skin is warm and dry.  Neurological:     General: No focal deficit present.     Mental Status: She is alert and oriented to person, place, and time.  Psychiatric:        Mood and Affect: Mood normal.        Behavior: Behavior normal.        Thought Content: Thought content normal.        Judgment: Judgment normal.       Assessment & Plan:  1. Palpitations - benign palpitations  - Advised follow up if they present more frequently and then could consider cardiac monitor  - EKG 12-Lead- SR, Rate 89.  - T abnormality - Anteroseptal infarct- age undetermined - Overread - no Q waves noted   Dorothyann Peng, NP

## 2020-04-13 NOTE — Progress Notes (Signed)
ekg 

## 2020-08-17 DIAGNOSIS — R928 Other abnormal and inconclusive findings on diagnostic imaging of breast: Secondary | ICD-10-CM | POA: Diagnosis not present

## 2020-08-17 DIAGNOSIS — N6002 Solitary cyst of left breast: Secondary | ICD-10-CM | POA: Diagnosis not present

## 2020-08-17 LAB — HM MAMMOGRAPHY

## 2020-08-19 ENCOUNTER — Encounter: Payer: Self-pay | Admitting: Family Medicine

## 2020-09-19 ENCOUNTER — Emergency Department (HOSPITAL_BASED_OUTPATIENT_CLINIC_OR_DEPARTMENT_OTHER)
Admission: EM | Admit: 2020-09-19 | Discharge: 2020-09-19 | Disposition: A | Discharge status: Home / Self Care | Payer: BC Managed Care – PPO | Attending: Emergency Medicine | Admitting: Emergency Medicine

## 2020-09-19 ENCOUNTER — Encounter (HOSPITAL_BASED_OUTPATIENT_CLINIC_OR_DEPARTMENT_OTHER): Payer: Self-pay | Admitting: Obstetrics and Gynecology

## 2020-09-19 ENCOUNTER — Emergency Department (HOSPITAL_BASED_OUTPATIENT_CLINIC_OR_DEPARTMENT_OTHER): Payer: BC Managed Care – PPO | Admitting: Radiology

## 2020-09-19 ENCOUNTER — Other Ambulatory Visit: Payer: Self-pay

## 2020-09-19 DIAGNOSIS — Z23 Encounter for immunization: Secondary | ICD-10-CM | POA: Insufficient documentation

## 2020-09-19 DIAGNOSIS — W1839XA Other fall on same level, initial encounter: Secondary | ICD-10-CM | POA: Diagnosis not present

## 2020-09-19 DIAGNOSIS — S0011XA Contusion of right eyelid and periocular area, initial encounter: Secondary | ICD-10-CM | POA: Insufficient documentation

## 2020-09-19 DIAGNOSIS — R0781 Pleurodynia: Secondary | ICD-10-CM | POA: Diagnosis not present

## 2020-09-19 DIAGNOSIS — S20211A Contusion of right front wall of thorax, initial encounter: Secondary | ICD-10-CM | POA: Insufficient documentation

## 2020-09-19 DIAGNOSIS — S299XXA Unspecified injury of thorax, initial encounter: Secondary | ICD-10-CM | POA: Diagnosis not present

## 2020-09-19 DIAGNOSIS — Y93K1 Activity, walking an animal: Secondary | ICD-10-CM | POA: Diagnosis not present

## 2020-09-19 DIAGNOSIS — M542 Cervicalgia: Secondary | ICD-10-CM | POA: Diagnosis not present

## 2020-09-19 MED ORDER — CYCLOBENZAPRINE HCL 10 MG PO TABS: 10.0000 mg | ORAL_TABLET | Freq: Two times a day (BID) | ORAL | 0 refills | Status: DC | PRN

## 2020-09-19 MED ORDER — TETANUS-DIPHTH-ACELL PERTUSSIS 5-2.5-18.5 LF-MCG/0.5 IM SUSY
0.5000 mL | PREFILLED_SYRINGE | Freq: Once | INTRAMUSCULAR | Status: AC
  Administered 2020-09-19: 0.5 mL via INTRAMUSCULAR
  Filled 2020-09-19: qty 0.5

## 2020-09-19 NOTE — ED Notes (Signed)
EDP at bedside  

## 2020-09-19 NOTE — ED Provider Notes (Signed)
[facility] EMERGENCY DEPT Provider Note   CSN: [id number] Arrival date & time: 09/19/20  1325     History Chief Complaint  Patient presents with  . Fall    Tracey Sweeney is a 65 y.o. female.  HPI      Dog pulled downfell onto right side, hit right side of head Got up walked back home When take deep breath having pain to right rib, near axilla, seems to be getting worse Not on blood thinners  No dyspnea or cough   No headache, sore area to front of face, was wearing glasses and has area of bruising, no LOC, no vomiting, no drainage out of nose or ear, no numbness/weakness/visual changes Neck pain but thinks it is muscular.  Fall occurred greater than 4 hours ago.   Past Medical History:  Diagnosis Date  . Allergy   . Arthritis    has an autoimmune disease  . Cataract    Bil surgery  . GERD (gastroesophageal reflux disease)   . Insomnia   . Kidney stones dec 2014   sees Dr. Aletha Halim with [facility]   . Varicose veins of legs     Patient Active Problem List   Diagnosis Date Noted  . Hematuria 07/25/2012  . BACK PAIN, LUMBAR 09/30/2009  . OLECRANON BURSITIS 12/14/2008  . VERTIGO, POSITIONAL 10/18/2007  . GERD 08/27/2007  . ABDOMINAL PAIN, LEFT UPPER QUADRANT 05/29/2007  . ALLERGIC RHINITIS 01/28/2007  . SKIN LESION 01/28/2007  . HX, PERSONAL, CERVICAL DYSPLASIA 01/28/2007  . CHICKENPOX, HX OF 01/28/2007    Past Surgical History:  Procedure Laterality Date  . CATARACT EXTRACTION     Bil  . cervix laser surgery    . COLONOSCOPY  12/16/2018   per Dr. Oretha Caprice, adenomatous polyps, repeat in 5 yrs   . KIDNEY STONE SURGERY     had 2 stents put in  . LITHOTRIPSY  2014  . POLYPECTOMY    . TONSILLECTOMY    . varicose veins     Dr. Eilleen Kempf     OB History   No obstetric history on file.     Family History  Problem Relation Age of Onset  . Cancer Other        skin, bladder, & breast  . Arthritis Other   . Diabetes Other   .  Hyperlipidemia Other   . Hypertension Other   . Kidney disease Other   . Bladder Cancer Father   . Melanoma Father   . Diabetes Father   . Melanoma Paternal Aunt   . Heart disease Mother   . Hypertension Mother   . Colon cancer Sister   . Emphysema Brother   . Colon cancer Cousin     Social History   Tobacco Use  . Smoking status: Never Smoker  . Smokeless tobacco: Never Used  Substance Use Topics  . Alcohol use: No    Alcohol/week: 0.0 standard drinks  . Drug use: No    Home Medications Prior to Admission medications   Medication Sig Start Date End Date Taking? Authorizing Provider  cyclobenzaprine (FLEXERIL) 10 MG tablet Take 1 tablet (10 mg total) by mouth 2 (two) times daily as needed for muscle spasms. 09/19/20  Yes Gareth Morgan, MD  Guaifenesin 1200 MG TB12 Take 1 tablet by mouth 2 (two) times daily.     [provider]  Levocetirizine Dihydrochloride (XYZAL PO) Take by mouth.    [provider]  Omega-3 Fatty Acids (FISH OIL)  1200 MG CAPS Take by mouth daily.    [provider]  omeprazole (PRILOSEC) 20 MG capsule Take 1 capsule (20 mg total) by mouth daily. 11/04/18   Laurey Morale, MD    Allergies    Etodolac, Diclofenac sodium, Penicillins, Sulfonamide derivatives, and Tetracyclines & related  Review of Systems   Review of Systems  Constitutional: Negative for fever.  HENT: Negative for sore throat.   Eyes: Negative for visual disturbance.  Respiratory: Negative for cough and shortness of breath.   Cardiovascular: Positive for chest pain.  Gastrointestinal: Negative for abdominal pain, nausea and vomiting.  Genitourinary: Negative for difficulty urinating.  Musculoskeletal: Positive for neck pain (right sided). Negative for back pain.  Skin: Negative for rash.  Neurological: Negative for syncope, weakness, numbness and headaches.    Physical Exam Updated Vital Signs BP (!) 150/86 (BP Location: Left Arm)   Pulse 69   Temp  98.9 F (37.2 C) (Tympanic)   Resp 19   SpO2 97%   Physical Exam Vitals and nursing note reviewed.  Constitutional:      General: She is not in acute distress.    Appearance: She is well-developed. She is not diaphoretic.  HENT:     Head: Normocephalic.     Comments: Contusion right lateral inferior orbital area    Right Ear: Tympanic membrane normal.     Left Ear: Tympanic membrane normal.  Eyes:     Extraocular Movements: Extraocular movements intact.     Conjunctiva/sclera: Conjunctivae normal.     Pupils: Pupils are equal, round, and reactive to light.  Cardiovascular:     Rate and Rhythm: Normal rate and regular rhythm.     Heart sounds: Normal heart sounds. No murmur heard. No friction rub. No gallop.   Pulmonary:     Effort: Pulmonary effort is normal. No respiratory distress.     Breath sounds: Normal breath sounds. No wheezing or rales.  Abdominal:     General: There is no distension.     Palpations: Abdomen is soft.     Tenderness: There is no abdominal tenderness. There is no guarding.  Musculoskeletal:     Cervical back: Normal range of motion. Tenderness (right sided, no midline tenderness) present. No rigidity.  Skin:    General: Skin is warm and dry.     Findings: No erythema or rash.  Neurological:     Mental Status: She is alert and oriented to person, place, and time.     ED Results / Procedures / Treatments   Labs (all labs ordered are listed, but only abnormal results are displayed) Labs Reviewed - No data to display  EKG None  Radiology DG Ribs Unilateral W/Chest Right  Result Date: 09/19/2020 CLINICAL DATA:  Fall, rib pain right upper chest. EXAM: RIGHT RIBS AND CHEST - 3+ VIEW COMPARISON:  None. FINDINGS: No fracture or other bone lesions are seen involving the ribs. There is no evidence of pneumothorax or pleural effusion. Both lungs are clear. Heart size and mediastinal contours are within normal limits. IMPRESSION: Negative. Electronically  Signed   By: Rolm Baptise M.D.   On: 09/19/2020 14:38    Procedures Procedures   Medications Ordered in ED Medications  Tdap (BOOSTRIX) injection 0.5 mL (0.5 mLs Intramuscular Given 09/19/20 1558)    ED Course  I have reviewed the triage vital signs and the nursing notes.  Pertinent labs & imaging results that were available during my care of the patient were reviewed by me  and considered in my medical decision making (see chart for details).    MDM Rules/Calculators/A&P                          65yo female presents with concern for right chest wall pain after a fall while walking her dog.  Low suspicion for intracranial injury by [facility] CT rules.  Discussed possibility of facial fx however low suspicion overall and also has normal EOM and no visual changes and doubt presence of significant fx.  Has right rib tenderness. XR without pneumothorax or signs of rib fx. Discussed possibility of occult fx but given she is able to take deep breaths, has normal breath sounds, no pneumothorax on XR, do not feel CT will change course of care. Low suspicion for CSpine injury by NEXUS criteria.  Suspect most likely contusion and muscular pain. Discussed possibility of CT head/face and chest but agree given low suspicion for clinically significant injury agree to forego imaging. Treated with muscle relaxants. Patient discharged in stable condition with understanding of reasons to return.   Final Clinical Impression(s) / ED Diagnoses Final diagnoses:  Contusion of rib on right side, initial encounter  Contusion of right periocular region, initial encounter    Rx / DC Orders ED Discharge Orders         Ordered    cyclobenzaprine (FLEXERIL) 10 MG tablet  2 times daily PRN        09/19/20 1559           Gareth Morgan, MD 09/20/20 1146

## 2020-09-19 NOTE — ED Triage Notes (Signed)
Patient reports she fell and did a face plant after her dog pulled her. Patient reports pain in the right axilla/rib region following her fall. Patient reports painful deep inspiration. Patient reports the fall happened approximately x2 hours ago Denies LOC, denies emesis or nausea

## 2020-09-20 ENCOUNTER — Ambulatory Visit (INDEPENDENT_AMBULATORY_CARE_PROVIDER_SITE_OTHER): Payer: BC Managed Care – PPO

## 2020-09-20 ENCOUNTER — Ambulatory Visit: Payer: BC Managed Care – PPO | Admitting: Podiatry

## 2020-09-20 DIAGNOSIS — M79671 Pain in right foot: Secondary | ICD-10-CM

## 2020-09-20 NOTE — Patient Instructions (Signed)
Plantar Fasciitis (Heel Spur Syndrome) with Rehab The plantar fascia is a fibrous, ligament-like, soft-tissue structure that spans the bottom of the foot. Plantar fasciitis is a condition that causes pain in the foot due to inflammation of the tissue. SYMPTOMS   Pain and tenderness on the underneath side of the foot.  Pain that worsens with standing or walking. CAUSES  Plantar fasciitis is caused by irritation and injury to the plantar fascia on the underneath side of the foot. Common mechanisms of injury include:  Direct trauma to bottom of the foot.  Damage to a small nerve that runs under the foot where the main fascia attaches to the heel bone.  Stress placed on the plantar fascia due to bone spurs. RISK INCREASES WITH:   Activities that place stress on the plantar fascia (running, jumping, pivoting, or cutting).  Poor strength and flexibility.  Improperly fitted shoes.  Tight calf muscles.  Flat feet.  Failure to warm-up properly before activity.  Obesity. PREVENTION  Warm up and stretch properly before activity.  Allow for adequate recovery between workouts.  Maintain physical fitness:  Strength, flexibility, and endurance.  Cardiovascular fitness.  Maintain a health body weight.  Avoid stress on the plantar fascia.  Wear properly fitted shoes, including arch supports for individuals who have flat feet.  PROGNOSIS  If treated properly, then the symptoms of plantar fasciitis usually resolve without surgery. However, occasionally surgery is necessary.  RELATED COMPLICATIONS   Recurrent symptoms that may result in a chronic condition.  Problems of the lower back that are caused by compensating for the injury, such as limping.  Pain or weakness of the foot during push-off following surgery.  Chronic inflammation, scarring, and partial or complete fascia tear, occurring more often from repeated injections.  TREATMENT  Treatment initially involves the  use of ice and medication to help reduce pain and inflammation. The use of strengthening and stretching exercises may help reduce pain with activity, especially stretches of the Achilles tendon. These exercises may be performed at home or with a therapist. Your caregiver may recommend that you use heel cups of arch supports to help reduce stress on the plantar fascia. Occasionally, corticosteroid injections are given to reduce inflammation. If symptoms persist for greater than 6 months despite non-surgical (conservative), then surgery may be recommended.   MEDICATION   If pain medication is necessary, then nonsteroidal anti-inflammatory medications, such as aspirin and ibuprofen, or other minor pain relievers, such as acetaminophen, are often recommended.  Do not take pain medication within 7 days before surgery.  Prescription pain relievers may be given if deemed necessary by your caregiver. Use only as directed and only as much as you need.  Corticosteroid injections may be given by your caregiver. These injections should be reserved for the most serious cases, because they may only be given a certain number of times.  HEAT AND COLD  Cold treatment (icing) relieves pain and reduces inflammation. Cold treatment should be applied for 10 to 15 minutes every 2 to 3 hours for inflammation and pain and immediately after any activity that aggravates your symptoms. Use ice packs or massage the area with a piece of ice (ice massage).  Heat treatment may be used prior to performing the stretching and strengthening activities prescribed by your caregiver, physical therapist, or athletic trainer. Use a heat pack or soak the injury in warm water.  SEEK IMMEDIATE MEDICAL CARE IF:  Treatment seems to offer no benefit, or the condition worsens.  Any medications   produce adverse side effects.  EXERCISES- RANGE OF MOTION (ROM) AND STRETCHING EXERCISES - Plantar Fasciitis (Heel Spur Syndrome) These exercises  may help you when beginning to rehabilitate your injury. Your symptoms may resolve with or without further involvement from your physician, physical therapist or athletic trainer. While completing these exercises, remember:   Restoring tissue flexibility helps normal motion to return to the joints. This allows healthier, less painful movement and activity.  An effective stretch should be held for at least 30 seconds.  A stretch should never be painful. You should only feel a gentle lengthening or release in the stretched tissue.  RANGE OF MOTION - Toe Extension, Flexion  Sit with your right / left leg crossed over your opposite knee.  Grasp your toes and gently pull them back toward the top of your foot. You should feel a stretch on the bottom of your toes and/or foot.  Hold this stretch for 10 seconds.  Now, gently pull your toes toward the bottom of your foot. You should feel a stretch on the top of your toes and or foot.  Hold this stretch for 10 seconds. Repeat  times. Complete this stretch 3 times per day.   RANGE OF MOTION - Ankle Dorsiflexion, Active Assisted  Remove shoes and sit on a chair that is preferably not on a carpeted surface.  Place right / left foot under knee. Extend your opposite leg for support.  Keeping your heel down, slide your right / left foot back toward the chair until you feel a stretch at your ankle or calf. If you do not feel a stretch, slide your bottom forward to the edge of the chair, while still keeping your heel down.  Hold this stretch for 10 seconds. Repeat 3 times. Complete this stretch 2 times per day.   STRETCH  Gastroc, Standing  Place hands on wall.  Extend right / left leg, keeping the front knee somewhat bent.  Slightly point your toes inward on your back foot.  Keeping your right / left heel on the floor and your knee straight, shift your weight toward the wall, not allowing your back to arch.  You should feel a gentle stretch  in the right / left calf. Hold this position for 10 seconds. Repeat 3 times. Complete this stretch 2 times per day.  STRETCH  Soleus, Standing  Place hands on wall.  Extend right / left leg, keeping the other knee somewhat bent.  Slightly point your toes inward on your back foot.  Keep your right / left heel on the floor, bend your back knee, and slightly shift your weight over the back leg so that you feel a gentle stretch deep in your back calf.  Hold this position for 10 seconds. Repeat 3 times. Complete this stretch 2 times per day.  STRETCH  Gastrocsoleus, Standing  Note: This exercise can place a lot of stress on your foot and ankle. Please complete this exercise only if specifically instructed by your caregiver.   Place the ball of your right / left foot on a step, keeping your other foot firmly on the same step.  Hold on to the wall or a rail for balance.  Slowly lift your other foot, allowing your body weight to press your heel down over the edge of the step.  You should feel a stretch in your right / left calf.  Hold this position for 10 seconds.  Repeat this exercise with a slight bend in your right /   left knee. Repeat 3 times. Complete this stretch 2 times per day.   STRENGTHENING EXERCISES - Plantar Fasciitis (Heel Spur Syndrome)  These exercises may help you when beginning to rehabilitate your injury. They may resolve your symptoms with or without further involvement from your physician, physical therapist or athletic trainer. While completing these exercises, remember:   Muscles can gain both the endurance and the strength needed for everyday activities through controlled exercises.  Complete these exercises as instructed by your physician, physical therapist or athletic trainer. Progress the resistance and repetitions only as guided.  STRENGTH - Towel Curls  Sit in a chair positioned on a non-carpeted surface.  Place your foot on a towel, keeping your heel  on the floor.  Pull the towel toward your heel by only curling your toes. Keep your heel on the floor. Repeat 3 times. Complete this exercise 2 times per day.  STRENGTH - Ankle Inversion  Secure one end of a rubber exercise band/tubing to a fixed object (table, pole). Loop the other end around your foot just before your toes.  Place your fists between your knees. This will focus your strengthening at your ankle.  Slowly, pull your big toe up and in, making sure the band/tubing is positioned to resist the entire motion.  Hold this position for 10 seconds.  Have your muscles resist the band/tubing as it slowly pulls your foot back to the starting position. Repeat 3 times. Complete this exercises 2 times per day.  Document Released: 04/17/2005 Document Revised: 07/10/2011 Document Reviewed: 07/30/2008 [facility] Patient Information 2014 [place], [place].   Peroneal Tendinopathy Rehab Ask your health care provider which exercises are safe for you. Do exercises exactly as told by your health care provider and adjust them as directed. It is normal to feel mild stretching, pulling, tightness, or discomfort as you do these exercises. Stop right away if you feel sudden pain or your pain gets worse. Do not begin these exercises until told by your health care provider. Stretching and range-of-motion exercises These exercises warm up your muscles and joints and improve the movement and flexibility of your ankle. These exercises also help to relieve pain and stiffness. Gastroc and soleus stretch, standing This is an exercise in which you stand on a step and use your body weight to stretch your calf muscles. To do this exercise: 1. Stand on the edge of a step on the ball of your left / right foot. The ball of your foot is on the walking surface, right under your toes. 2. Keep your other foot firmly on the same step. 3. Hold on to the wall, a railing, or a chair for balance. 4. Slowly lift your other  foot, allowing your body weight to press your left / right heel down over the edge of the step. You should feel a stretch in your left / right calf (gastrocnemius and soleus). 5. Hold this position for __________ seconds. 6. Return both feet to the step. 7. Repeat this exercise with a slight bend in your left / right knee. Repeat __________ times with your left / right knee straight and __________ times with your left / right knee bent. Complete this exercise __________ times a day.   Strengthening exercises These exercises build strength and endurance in your foot and ankle. Endurance is the ability to use your muscles for a long time, even after they get tired. Ankle dorsiflexion with band 1. Secure a rubber exercise band or tube to an object, such as  a table leg, that will not move when the band is pulled. 2. Secure the other end of the band around your left / right foot. 3. Sit on the floor, facing the object with your left / right leg extended. The band or tube should be slightly tense when your foot is relaxed. 4. Slowly flex your left / right ankle and toes to bring your foot toward you (dorsiflexion). 5. Hold this position for __________ seconds. 6. Let the band or tube slowly pull your foot back to the starting position. Repeat __________ times. Complete this exercise __________ times a day.   Ankle eversion 1. Sit on the floor with your legs straight out in front of you. 2. Loop a rubber exercise band or tube around the ball of your left / right foot. The ball of your foot is on the walking surface, right under your toes. 3. Hold the ends of the band in your hands, or secure the band to a stable object. The band or tube should be slightly tense when your foot is relaxed. 4. Slowly push your foot outward, away from your other leg (eversion). 5. Hold this position for __________ seconds. 6. Slowly return your foot to the starting position. Repeat __________ times. Complete this exercise  __________ times a day. Plantar flexion, standing This exercise is sometimes called standing heel raise. 1. Stand with your feet shoulder-width apart. 2. Place your hands on a wall or table to steady yourself as needed, but try not to use it for support. 3. Keep your weight spread evenly over the width of your feet while you slowly rise up on your toes (plantar flexion). If told by your health care provider: ? Shift your weight toward your left / right leg until you feel challenged. ? Stand on your left / right leg only. 4. Hold this position for __________ seconds. Repeat __________ times. Complete this exercise __________ times a day.   Single leg stand 1. Without shoes, stand near a railing or in a doorway. You may hold on to the railing or door frame as needed. 2. Stand on your left / right foot. Keep your big toe down on the floor and try to keep your arch lifted. ? Do not roll to the outside of your foot. ? If this exercise is too easy, you can try it with your eyes closed or while standing on a pillow. 3. Hold this position for __________ seconds. Repeat __________ times. Complete this exercise __________ times a day. This information is not intended to replace advice given to you by your health care provider. Make sure you discuss any questions you have with your health care provider. Document Revised: 08/06/2018 Document Reviewed: 08/06/2018 Elsevier Patient Education  [place].

## 2020-09-22 ENCOUNTER — Encounter: Payer: Self-pay | Admitting: Family Medicine

## 2020-09-22 ENCOUNTER — Other Ambulatory Visit: Payer: Self-pay | Admitting: Podiatry

## 2020-09-22 ENCOUNTER — Other Ambulatory Visit: Payer: Self-pay

## 2020-09-22 ENCOUNTER — Ambulatory Visit (INDEPENDENT_AMBULATORY_CARE_PROVIDER_SITE_OTHER): Payer: BC Managed Care – PPO | Admitting: Family Medicine

## 2020-09-22 VITALS — BP 110/78 | HR 95 | Temp 98.5°F | Wt 153.6 lb

## 2020-09-22 DIAGNOSIS — S20211D Contusion of right front wall of thorax, subsequent encounter: Secondary | ICD-10-CM | POA: Diagnosis not present

## 2020-09-22 DIAGNOSIS — S0083XD Contusion of other part of head, subsequent encounter: Secondary | ICD-10-CM

## 2020-09-22 DIAGNOSIS — M79671 Pain in right foot: Secondary | ICD-10-CM

## 2020-09-22 MED ORDER — TRAMADOL HCL 50 MG PO TABS: 100.0000 mg | ORAL_TABLET | Freq: Four times a day (QID) | ORAL | 0 refills | Status: DC | PRN

## 2020-09-22 NOTE — Progress Notes (Signed)
Subjective:   Patient ID: Tracey Sweeney, female   DOB: 65 y.o.   MRN: [id number]   HPI 65 year old female presents the office today for concerns of right foot pain near the Achilles tendon.  She states she has pain in the morning when she first gets up.  Also if she does a prolonged walking will cause discomfort.  Pain is at a 4-5/10 level.  This been ongoing for now for couple of months.  No recent injury or trauma.  No swelling.  She has no other concerns today.   Review of Systems  All other systems reviewed and are negative.  Past Medical History:  Diagnosis Date  . Allergy   . Arthritis    has an autoimmune disease  . Cataract    Bil surgery  . GERD (gastroesophageal reflux disease)   . Insomnia   . Kidney stones dec 2014   sees Dr. Aletha Halim with [facility]   . Varicose veins of legs     Past Surgical History:  Procedure Laterality Date  . CATARACT EXTRACTION     Bil  . cervix laser surgery    . COLONOSCOPY  12/16/2018   per Dr. Oretha Caprice, adenomatous polyps, repeat in 5 yrs   . KIDNEY STONE SURGERY     had 2 stents put in  . LITHOTRIPSY  2014  . POLYPECTOMY    . TONSILLECTOMY    . varicose veins     Dr. Eilleen Kempf     Current Outpatient Medications:  .  cyclobenzaprine (FLEXERIL) 10 MG tablet, Take 1 tablet (10 mg total) by mouth 2 (two) times daily as needed for muscle spasms., Disp: 20 tablet, Rfl: 0 .  Guaifenesin 1200 MG TB12, Take 1 tablet by mouth 2 (two) times daily. , Disp: , Rfl:  .  Levocetirizine Dihydrochloride (XYZAL PO), Take by mouth., Disp: , Rfl:  .  Omega-3 Fatty Acids (FISH OIL) 1200 MG CAPS, Take by mouth daily., Disp: , Rfl:  .  omeprazole (PRILOSEC) 20 MG capsule, Take 1 capsule (20 mg total) by mouth daily., Disp: 30 capsule, Rfl: 3  Current Facility-Administered Medications:  .  triamcinolone acetonide (KENALOG) 10 MG/ML injection 10 mg, 10 mg, Other, Once, Trula Slade, DPM  Allergies  Allergen Reactions  . Etodolac     Made  stomach bleed  . Diclofenac Sodium Other (See Comments)    Pt unsure of reaction  . Penicillins     REACTION: Hives  . Sulfonamide Derivatives     REACTION: Nausea/vomiting  . Tetracyclines & Related     Syncope          Objective:  Physical Exam  General: AAO x3, NAD  Dermatological: Skin is warm, dry and supple bilateral.  There are no open sores, no preulcerative lesions, no rash or signs of infection present.  Vascular: Dorsalis Pedis artery and Posterior Tibial artery pedal pulses are 2/4 bilateral with immedate capillary fill time. There is no pain with calf compression, swelling, warmth, erythema.   Neruologic: Grossly intact via light touch bilateral.   Musculoskeletal: There is tenderness palpation on plantar aspect of calcaneus with insertion of plantar fascial remodeling of the distal portion Achilles tendon.  The Achilles tendon appears to be intact.  There is no pain with all compression of calcaneus.  There is mild discomfort on the course of the peroneal tendon inferior to lateral malleolus.  Peroneal tendon appears to be intact.  No area of pinpoint tenderness.  No significant  edema, erythema.  Muscular strength 5/5 in all groups tested bilateral.  Gait: Unassisted, Nonantalgic.       Assessment:   65 year old female with heel pain, denies has plan fasciitis     Plan:  -Treatment options discussed including all alternatives, risks, and complications -Etiology of symptoms were discussed -X-rays were obtained and reviewed with the patient.  No evidence of acute fracture or stress fracture. -Discussed stretching, icing daily.  She is also can use Voltaren gel.  Discussed shoe modifications, arch support.  If no improvement I will order a Medrol Dosepak for her.  Discussed steroid injection today as well but would hold off on this.  Trula Slade DPM

## 2020-09-22 NOTE — Progress Notes (Signed)
   Subjective:    Patient ID: Tracey Sweeney, female    DOB: Dec 18, 1955, 65 y.o.   MRN: [id number]  HPI Here to follow up on injuries from a fall on 09-19-20. She was walking a dog and the dog jerked the lease, causing her to fall. She struck the ground with her face and with the right trunk. No LOC. She was able to walk away and drive herself to the ED. While there her exam showed bruising to the face and to the right ribs. Xrays showed no rib fractures. No scans were obtained of her face or head. She was given some Flexeril to take, and she has added some Ibuprofen to that. She is quite sore around the right side of the face, but her vision is normal and she can chew normally. She is sore all the way around her trunk, and she has a fair amount of pain when she breathes deeply. No SOB. No headache or dizziness or nausea.    Review of Systems  Constitutional: Negative.   HENT: Positive for facial swelling.   Eyes: Negative.   Respiratory: Negative.   Cardiovascular: Positive for chest pain. Negative for palpitations and leg swelling.  Neurological: Negative.        Objective:   Physical Exam Constitutional:      General: She is not in acute distress. HENT:     Head:     Comments: There is extensive ecchymosis around the right orbit with tenderness but no crepitus.     Right Ear: Tympanic membrane, ear canal and external ear normal.     Left Ear: Tympanic membrane, ear canal and external ear normal.     Nose: Nose normal.     Mouth/Throat:     Pharynx: Oropharynx is clear.  Eyes:     Extraocular Movements: Extraocular movements intact.     Conjunctiva/sclera: Conjunctivae normal.     Pupils: Pupils are equal, round, and reactive to light.  Cardiovascular:     Rate and Rhythm: Normal rate and regular rhythm.     Pulses: Normal pulses.     Heart sounds: Normal heart sounds.  Pulmonary:     Effort: Pulmonary effort is normal.     Breath sounds: Normal breath sounds.     Comments: She  is tender over the right ribs  Musculoskeletal:     Cervical back: Neck supple.  Lymphadenopathy:     Cervical: No cervical adenopathy.  Neurological:     Mental Status: She is alert.           Assessment & Plan:  She has contusions to the right face and right ribs. This will heal over the next few weeks. She can use Tramadol for pain. Recheck as needed. We seont 40 minutes reviewing records and dealing with these issues.  Alysia Penna, MD

## 2020-10-18 ENCOUNTER — Ambulatory Visit (INDEPENDENT_AMBULATORY_CARE_PROVIDER_SITE_OTHER): Payer: BC Managed Care – PPO | Admitting: Family Medicine

## 2020-10-18 ENCOUNTER — Encounter: Payer: Self-pay | Admitting: Family Medicine

## 2020-10-18 ENCOUNTER — Other Ambulatory Visit: Payer: Self-pay

## 2020-10-18 VITALS — BP 124/76 | HR 59 | Temp 98.3°F | Ht 66.0 in | Wt 152.2 lb

## 2020-10-18 DIAGNOSIS — N390 Urinary tract infection, site not specified: Secondary | ICD-10-CM

## 2020-10-18 DIAGNOSIS — M810 Age-related osteoporosis without current pathological fracture: Secondary | ICD-10-CM | POA: Diagnosis not present

## 2020-10-18 DIAGNOSIS — Z0000 Encounter for general adult medical examination without abnormal findings: Secondary | ICD-10-CM

## 2020-10-18 DIAGNOSIS — J3089 Other allergic rhinitis: Secondary | ICD-10-CM | POA: Diagnosis not present

## 2020-10-18 DIAGNOSIS — R8290 Unspecified abnormal findings in urine: Secondary | ICD-10-CM | POA: Diagnosis not present

## 2020-10-18 LAB — HEPATIC FUNCTION PANEL
ALT: 15 U/L (ref 0–35)
AST: 20 U/L (ref 0–37)
Albumin: 4.5 g/dL (ref 3.5–5.2)
Alkaline Phosphatase: 84 U/L (ref 39–117)
Bilirubin, Direct: 0.1 mg/dL (ref 0.0–0.3)
Total Bilirubin: 0.4 mg/dL (ref 0.2–1.2)
Total Protein: 6.9 g/dL (ref 6.0–8.3)

## 2020-10-18 LAB — POC URINALSYSI DIPSTICK (AUTOMATED)
Bilirubin, UA: NEGATIVE
Blood, UA: NEGATIVE
Glucose, UA: NEGATIVE
Ketones, UA: NEGATIVE
Nitrite, UA: POSITIVE
Protein, UA: NEGATIVE
Spec Grav, UA: 1.015 (ref 1.010–1.025)
Urobilinogen, UA: 0.2 E.U./dL
pH, UA: 6 (ref 5.0–8.0)

## 2020-10-18 LAB — BASIC METABOLIC PANEL
BUN: 11 mg/dL (ref 6–23)
CO2: 28 mEq/L (ref 19–32)
Calcium: 9.7 mg/dL (ref 8.4–10.5)
Chloride: 104 mEq/L (ref 96–112)
Creatinine, Ser: 0.63 mg/dL (ref 0.40–1.20)
GFR: 94.55 mL/min (ref >60.00)
Glucose, Bld: 92 mg/dL (ref 70–99)
Potassium: 4.2 mEq/L (ref 3.5–5.1)
Sodium: 141 mEq/L (ref 135–145)

## 2020-10-18 LAB — LIPID PANEL
Cholesterol: 221 mg/dL — ABNORMAL HIGH (ref 0–200)
HDL: 62.5 mg/dL (ref >39.00)
LDL Cholesterol: 135 mg/dL — ABNORMAL HIGH (ref 0–99)
NonHDL: 158.16
Total CHOL/HDL Ratio: 4
Triglycerides: 117 mg/dL (ref 0.0–149.0)
VLDL: 23.4 mg/dL (ref 0.0–40.0)

## 2020-10-18 LAB — CBC WITH DIFFERENTIAL/PLATELET
Basophils Absolute: 0.1 10*3/uL (ref 0.0–0.1)
Basophils Relative: 1 % (ref 0.0–3.0)
Eosinophils Absolute: 0.1 10*3/uL (ref 0.0–0.7)
Eosinophils Relative: 1.2 % (ref 0.0–5.0)
HCT: 39.6 % (ref 36.0–46.0)
Hemoglobin: 13.6 g/dL (ref 12.0–15.0)
Lymphocytes Relative: 34.5 % (ref 12.0–46.0)
Lymphs Abs: 1.8 10*3/uL (ref 0.7–4.0)
MCHC: 34.3 g/dL (ref 30.0–36.0)
MCV: 88.1 fl (ref 78.0–100.0)
Monocytes Absolute: 0.5 10*3/uL (ref 0.1–1.0)
Monocytes Relative: 9.2 % (ref 3.0–12.0)
Neutro Abs: 2.8 10*3/uL (ref 1.4–7.7)
Neutrophils Relative %: 54.1 % (ref 43.0–77.0)
Platelets: 226 10*3/uL (ref 150.0–400.0)
RBC: 4.5 Mil/uL (ref 3.87–5.11)
RDW: 12.9 % (ref 11.5–15.5)
WBC: 5.2 10*3/uL (ref 4.0–10.5)

## 2020-10-18 LAB — HEMOGLOBIN A1C: Hgb A1c MFr Bld: 5.6 % (ref 4.6–6.5)

## 2020-10-18 LAB — TSH: TSH: 1.3 u[IU]/mL (ref 0.35–4.50)

## 2020-10-18 LAB — T4, FREE: Free T4: 0.82 ng/dL (ref 0.60–1.60)

## 2020-10-18 LAB — MAGNESIUM: Magnesium: 2 mg/dL (ref 1.5–2.5)

## 2020-10-18 LAB — VITAMIN B12: Vitamin B-12: 142 pg/mL — ABNORMAL LOW (ref 211–911)

## 2020-10-18 LAB — T3, FREE: T3, Free: 3.4 pg/mL (ref 2.3–4.2)

## 2020-10-18 MED ORDER — OMEPRAZOLE 20 MG PO CPDR: 20.0000 mg | DELAYED_RELEASE_CAPSULE | Freq: Every day | ORAL | 3 refills | Status: DC

## 2020-10-18 MED ORDER — CIPROFLOXACIN HCL 500 MG PO TABS: 500.0000 mg | ORAL_TABLET | Freq: Two times a day (BID) | ORAL | 0 refills | Status: DC

## 2020-10-18 NOTE — Progress Notes (Signed)
   Subjective:    Patient ID: Tracey Sweeney, female    DOB: [birth year], 65 y.o.   MRN: [id number]  HPI Here for a well exam. She is complaining of 2 weeks of urinary urgency and a foul odor to the urine. No fever. She is drinking water and cranberry juice. Also her allergies have been worse than ever, causing head congestion, blocked ears, and PND. This is despite taking Xyzal and Mucinex, and using Flonase daily.    Review of Systems  Constitutional: Negative.   HENT:  Positive for congestion and postnasal drip.   Eyes: Negative.   Respiratory: Negative.    Cardiovascular: Negative.   Gastrointestinal: Negative.   Genitourinary:  Positive for frequency and urgency. Negative for decreased urine volume, difficulty urinating, dyspareunia, dysuria, enuresis, flank pain, hematuria and pelvic pain.  Musculoskeletal: Negative.   Skin: Negative.   Neurological:  Negative for headaches.  Psychiatric/Behavioral: Negative.        Objective:   Physical Exam Constitutional:      General: She is not in acute distress.    Appearance: Normal appearance. She is well-developed.  HENT:     Head: Normocephalic and atraumatic.     Right Ear: External ear normal.     Left Ear: External ear normal.     Nose: Nose normal.     Mouth/Throat:     Pharynx: No oropharyngeal exudate.  Eyes:     General: No scleral icterus.    Conjunctiva/sclera: Conjunctivae normal.     Pupils: Pupils are equal, round, and reactive to light.  Neck:     Thyroid: No thyromegaly.     Vascular: No JVD.  Cardiovascular:     Rate and Rhythm: Normal rate and regular rhythm.     Heart sounds: Normal heart sounds. No murmur heard.   No friction rub. No gallop.  Pulmonary:     Effort: Pulmonary effort is normal. No respiratory distress.     Breath sounds: Normal breath sounds. No wheezing or rales.  Chest:     Chest wall: No tenderness.  Abdominal:     General: Bowel sounds are normal. There is no distension.      Palpations: Abdomen is soft. There is no mass.     Tenderness: There is no abdominal tenderness. There is no guarding or rebound.  Musculoskeletal:        General: No tenderness. Normal range of motion.     Cervical back: Normal range of motion and neck supple.  Lymphadenopathy:     Cervical: No cervical adenopathy.  Skin:    General: Skin is warm and dry.     Findings: No erythema or rash.  Neurological:     Mental Status: She is alert and oriented to person, place, and time.     Cranial Nerves: No cranial nerve deficit.     Motor: No abnormal muscle tone.     Coordination: Coordination normal.     Deep Tendon Reflexes: Reflexes are normal and symmetric. Reflexes normal.  Psychiatric:        Behavior: Behavior normal.        Thought Content: Thought content normal.        Judgment: Judgment normal.          Assessment & Plan:  Well exam. We discussed diet and exercise. Get fasting labs. Set up her first DEXA. Treat the UTI with Cipro and culture the sample. Refer to Allergy for her allergies.  Alysia Penna, MD

## 2020-10-19 ENCOUNTER — Other Ambulatory Visit: Payer: Self-pay

## 2020-10-19 DIAGNOSIS — E538 Deficiency of other specified B group vitamins: Secondary | ICD-10-CM

## 2020-10-20 LAB — URINE CULTURE
MICRO NUMBER:: 12026825
SPECIMEN QUALITY:: ADEQUATE

## 2020-10-21 ENCOUNTER — Other Ambulatory Visit: Payer: Self-pay

## 2020-10-21 ENCOUNTER — Ambulatory Visit (INDEPENDENT_AMBULATORY_CARE_PROVIDER_SITE_OTHER)
Admission: RE | Admit: 2020-10-21 | Discharge: 2020-10-21 | Disposition: A | Discharge status: Home / Self Care | Payer: BC Managed Care – PPO | Source: Ambulatory Visit | Attending: Family Medicine | Admitting: Family Medicine

## 2020-10-21 DIAGNOSIS — M810 Age-related osteoporosis without current pathological fracture: Secondary | ICD-10-CM

## 2020-10-24 DIAGNOSIS — M810 Age-related osteoporosis without current pathological fracture: Secondary | ICD-10-CM | POA: Diagnosis not present

## 2020-10-26 ENCOUNTER — Other Ambulatory Visit: Payer: Self-pay

## 2020-10-26 MED ORDER — ALENDRONATE SODIUM 70 MG PO TABS: 70.0000 mg | ORAL_TABLET | ORAL | 3 refills | Status: DC

## 2020-10-26 NOTE — Progress Notes (Signed)
Reviewed Dexa scan results with pt verbalized understanding, Rx for fosamax was sent to pt pharmacy, pt is aware to pick up medication. Pt wants advise on if to continue taking Omeprazole an if not what she needs to do next. Please advise

## 2020-12-31 ENCOUNTER — Other Ambulatory Visit: Payer: Self-pay

## 2020-12-31 ENCOUNTER — Telehealth: Payer: Self-pay

## 2020-12-31 MED ORDER — CIPROFLOXACIN HCL 500 MG PO TABS: 500.0000 mg | ORAL_TABLET | Freq: Two times a day (BID) | ORAL | 0 refills | Status: DC

## 2020-12-31 NOTE — Telephone Encounter (Signed)
Patient called stating she has a Uti and would like to know if Dr. Sarajane Jews will send in Rx to the pharmacy. Pt would like a call back

## 2020-12-31 NOTE — Telephone Encounter (Signed)
Rx sent per Dr Sarajane Jews

## 2020-12-31 NOTE — Telephone Encounter (Signed)
Patient was last seen on 10/18/20, history of UTI.

## 2020-12-31 NOTE — Telephone Encounter (Signed)
Call in Cipro 500 mg BID for 7 days  

## 2020-12-31 NOTE — Telephone Encounter (Signed)
Pt was notified.  

## 2021-01-04 DIAGNOSIS — J3089 Other allergic rhinitis: Secondary | ICD-10-CM | POA: Diagnosis not present

## 2021-01-04 DIAGNOSIS — H9203 Otalgia, bilateral: Secondary | ICD-10-CM | POA: Diagnosis not present

## 2021-01-19 ENCOUNTER — Other Ambulatory Visit (INDEPENDENT_AMBULATORY_CARE_PROVIDER_SITE_OTHER): Payer: BC Managed Care – PPO

## 2021-01-19 ENCOUNTER — Other Ambulatory Visit: Payer: Self-pay

## 2021-01-19 DIAGNOSIS — E538 Deficiency of other specified B group vitamins: Secondary | ICD-10-CM | POA: Diagnosis not present

## 2021-01-19 DIAGNOSIS — R7989 Other specified abnormal findings of blood chemistry: Secondary | ICD-10-CM

## 2021-01-19 LAB — VITAMIN B12: Vitamin B-12: 698 pg/mL (ref 211–911)

## 2021-01-25 ENCOUNTER — Encounter: Payer: Self-pay | Admitting: Family Medicine

## 2021-01-26 NOTE — Telephone Encounter (Signed)
I think we did this yesterday

## 2021-02-10 ENCOUNTER — Encounter: Payer: Self-pay | Admitting: Family Medicine

## 2021-02-10 DIAGNOSIS — N644 Mastodynia: Secondary | ICD-10-CM | POA: Diagnosis not present

## 2021-02-10 LAB — HM MAMMOGRAPHY

## 2021-02-17 ENCOUNTER — Encounter: Payer: Self-pay | Admitting: Family Medicine

## 2021-08-22 DIAGNOSIS — Z87442 Personal history of urinary calculi: Secondary | ICD-10-CM | POA: Diagnosis not present

## 2021-08-22 DIAGNOSIS — R319 Hematuria, unspecified: Secondary | ICD-10-CM | POA: Diagnosis not present

## 2021-08-29 DIAGNOSIS — J3089 Other allergic rhinitis: Secondary | ICD-10-CM | POA: Diagnosis not present

## 2021-08-29 DIAGNOSIS — H9203 Otalgia, bilateral: Secondary | ICD-10-CM | POA: Diagnosis not present

## 2021-09-01 DIAGNOSIS — N202 Calculus of kidney with calculus of ureter: Secondary | ICD-10-CM | POA: Diagnosis not present

## 2021-09-05 DIAGNOSIS — Z87442 Personal history of urinary calculi: Secondary | ICD-10-CM | POA: Diagnosis not present

## 2021-09-05 DIAGNOSIS — R319 Hematuria, unspecified: Secondary | ICD-10-CM | POA: Diagnosis not present

## 2021-09-06 ENCOUNTER — Encounter: Payer: Self-pay | Admitting: Family Medicine

## 2021-09-06 DIAGNOSIS — N6313 Unspecified lump in the right breast, lower outer quadrant: Secondary | ICD-10-CM

## 2021-09-06 NOTE — Telephone Encounter (Signed)
I will need to see a copy of the CT report first  ?

## 2021-09-07 NOTE — Telephone Encounter (Signed)
I ordered a mammogram and [place]  ?

## 2021-09-19 DIAGNOSIS — R319 Hematuria, unspecified: Secondary | ICD-10-CM | POA: Diagnosis not present

## 2021-09-19 DIAGNOSIS — Z87442 Personal history of urinary calculi: Secondary | ICD-10-CM | POA: Diagnosis not present

## 2021-09-19 DIAGNOSIS — N201 Calculus of ureter: Secondary | ICD-10-CM | POA: Diagnosis not present

## 2021-09-19 NOTE — Telephone Encounter (Signed)
I ordered these on 09-07-21 and sent them to the [facility]. Please cancel these and reorder them to [facility]

## 2021-09-20 DIAGNOSIS — N6489 Other specified disorders of breast: Secondary | ICD-10-CM | POA: Diagnosis not present

## 2021-09-20 DIAGNOSIS — R928 Other abnormal and inconclusive findings on diagnostic imaging of breast: Secondary | ICD-10-CM | POA: Diagnosis not present

## 2021-09-20 LAB — HM MAMMOGRAPHY

## 2021-09-22 ENCOUNTER — Encounter: Payer: Self-pay | Admitting: Family Medicine

## 2021-09-23 ENCOUNTER — Encounter: Payer: Self-pay | Admitting: Family Medicine

## 2021-09-23 ENCOUNTER — Telehealth: Payer: Self-pay

## 2021-09-23 DIAGNOSIS — N6313 Unspecified lump in the right breast, lower outer quadrant: Secondary | ICD-10-CM

## 2021-09-23 NOTE — Telephone Encounter (Signed)
Referral sent to [facility] per Dr Sarajane Jews

## 2021-09-23 NOTE — Telephone Encounter (Signed)
Pt referral for breast US was sent to [facility] per Dr Sarajane Jews.

## 2021-09-29 DIAGNOSIS — N200 Calculus of kidney: Secondary | ICD-10-CM | POA: Diagnosis not present

## 2021-10-04 ENCOUNTER — Ambulatory Visit (INDEPENDENT_AMBULATORY_CARE_PROVIDER_SITE_OTHER): Payer: BC Managed Care – PPO | Admitting: Family Medicine

## 2021-10-04 ENCOUNTER — Encounter: Payer: Self-pay | Admitting: Family Medicine

## 2021-10-04 VITALS — BP 118/78 | HR 78 | Temp 98.6°F | Wt 155.0 lb

## 2021-10-04 DIAGNOSIS — I8392 Asymptomatic varicose veins of left lower extremity: Secondary | ICD-10-CM

## 2021-10-04 NOTE — Progress Notes (Signed)
   Subjective:    Patient ID: Tracey Sweeney, female    DOB: 1955/12/17, 66 y.o.   MRN: [id number]  HPI Here to check a lump she felt on the left lower leg about 2 weeks ago. It does not bother her at all. No hx of recent trauma. No swelling in the leg or foot.    Review of Systems  Constitutional: Negative.   Respiratory: Negative.    Cardiovascular: Negative.       Objective:   Physical Exam Constitutional:      General: She is not in acute distress.    Appearance: Normal appearance.  Cardiovascular:     Rate and Rhythm: Normal rate and regular rhythm.     Pulses: Normal pulses.     Heart sounds: Normal heart sounds.  Pulmonary:     Effort: Pulmonary effort is normal.     Breath sounds: Normal breath sounds.  Musculoskeletal:     Comments: The posterior left lower leg has a small (about 3 mm) round firm mobile non-tender mass which is just below the skin   Neurological:     Mental Status: She is alert.          Assessment & Plan:  The lump is consistent with a small scarred area from a previously inflamed varicose vein (she was treated for varicose veins in the past). This is benign and should never bother her, but she will keep an eye on it.  Alysia Penna, MD

## 2021-10-14 DIAGNOSIS — N200 Calculus of kidney: Secondary | ICD-10-CM | POA: Diagnosis not present

## 2021-12-14 DIAGNOSIS — M65321 Trigger finger, right index finger: Secondary | ICD-10-CM | POA: Diagnosis not present

## 2021-12-14 DIAGNOSIS — M65322 Trigger finger, left index finger: Secondary | ICD-10-CM | POA: Diagnosis not present

## 2021-12-16 ENCOUNTER — Ambulatory Visit (INDEPENDENT_AMBULATORY_CARE_PROVIDER_SITE_OTHER): Payer: BC Managed Care – PPO | Admitting: Family Medicine

## 2021-12-16 ENCOUNTER — Encounter: Payer: Self-pay | Admitting: Family Medicine

## 2021-12-16 VITALS — BP 124/76 | HR 76 | Temp 98.7°F | Wt 140.4 lb

## 2021-12-16 DIAGNOSIS — F4321 Adjustment disorder with depressed mood: Secondary | ICD-10-CM | POA: Diagnosis not present

## 2021-12-16 DIAGNOSIS — G4700 Insomnia, unspecified: Secondary | ICD-10-CM

## 2021-12-16 MED ORDER — ZOLPIDEM TARTRATE 10 MG PO TABS: 10.0000 mg | ORAL_TABLET | Freq: Every evening | ORAL | 1 refills | Status: DC | PRN

## 2021-12-16 NOTE — Progress Notes (Signed)
   Subjective:    Patient ID: Tracey Sweeney, female    DOB: 1955-12-18, 66 y.o.   MRN: [id number]  HPI Here asking for help with insomnia. Her sister who lives in [place] recently passed away from colon cancer, and Tracey Sweeney is having trouble with the grief process. She is also very stressed because she has been the main person to sort out her sister's financial affairs. She has trouble sleeping now. She used Zolpidem successfully in the past after her father passed away.    Review of Systems  Constitutional: Negative.   Respiratory: Negative.    Cardiovascular: Negative.   Psychiatric/Behavioral:  Positive for dysphoric mood and sleep disturbance. Negative for agitation, behavioral problems, confusion, decreased concentration and hallucinations. The patient is nervous/anxious.        Objective:   Physical Exam Constitutional:      Appearance: Normal appearance.  Cardiovascular:     Rate and Rhythm: Normal rate and regular rhythm.     Pulses: Normal pulses.     Heart sounds: Normal heart sounds.  Pulmonary:     Effort: Pulmonary effort is normal.     Breath sounds: Normal breath sounds.  Neurological:     Mental Status: She is alert.  Psychiatric:        Behavior: Behavior normal.        Thought Content: Thought content normal.     Comments: Tearful            Assessment & Plan:  Grief related insomnia. She will try Zolpidem 10 mg at bedtime.  Alysia Penna, MD

## 2021-12-16 NOTE — Addendum Note (Signed)
Addended by: Alysia Penna A on: 12/16/2021 04:47 PM   Modules accepted: Orders

## 2021-12-23 ENCOUNTER — Ambulatory Visit (INDEPENDENT_AMBULATORY_CARE_PROVIDER_SITE_OTHER): Payer: BC Managed Care – PPO | Admitting: Family Medicine

## 2021-12-23 ENCOUNTER — Encounter: Payer: Self-pay | Admitting: Family Medicine

## 2021-12-23 VITALS — BP 108/64 | HR 68 | Temp 98.6°F | Ht 65.5 in | Wt 136.0 lb

## 2021-12-23 DIAGNOSIS — Z0000 Encounter for general adult medical examination without abnormal findings: Secondary | ICD-10-CM | POA: Diagnosis not present

## 2021-12-23 LAB — CBC WITH DIFFERENTIAL/PLATELET
Basophils Absolute: 0.1 10*3/uL (ref 0.0–0.1)
Basophils Relative: 1 % (ref 0.0–3.0)
Eosinophils Absolute: 0 10*3/uL (ref 0.0–0.7)
Eosinophils Relative: 0.5 % (ref 0.0–5.0)
HCT: 40.1 % (ref 36.0–46.0)
Hemoglobin: 13.9 g/dL (ref 12.0–15.0)
Lymphocytes Relative: 26.7 % (ref 12.0–46.0)
Lymphs Abs: 1.9 10*3/uL (ref 0.7–4.0)
MCHC: 34.6 g/dL (ref 30.0–36.0)
MCV: 88.3 fl (ref 78.0–100.0)
Monocytes Absolute: 0.7 10*3/uL (ref 0.1–1.0)
Monocytes Relative: 9.4 % (ref 3.0–12.0)
Neutro Abs: 4.4 10*3/uL (ref 1.4–7.7)
Neutrophils Relative %: 62.4 % (ref 43.0–77.0)
Platelets: 268 10*3/uL (ref 150.0–400.0)
RBC: 4.54 Mil/uL (ref 3.87–5.11)
RDW: 14.1 % (ref 11.5–15.5)
WBC: 7 10*3/uL (ref 4.0–10.5)

## 2021-12-23 LAB — BASIC METABOLIC PANEL
BUN: 15 mg/dL (ref 6–23)
CO2: 27 mEq/L (ref 19–32)
Calcium: 9.4 mg/dL (ref 8.4–10.5)
Chloride: 102 mEq/L (ref 96–112)
Creatinine, Ser: 0.74 mg/dL (ref 0.40–1.20)
GFR: 85.52 mL/min (ref >60.00)
Glucose, Bld: 95 mg/dL (ref 70–99)
Potassium: 4.3 mEq/L (ref 3.5–5.1)
Sodium: 140 mEq/L (ref 135–145)

## 2021-12-23 LAB — VITAMIN D 25 HYDROXY (VIT D DEFICIENCY, FRACTURES): VITD: 36.87 ng/mL (ref 30.00–100.00)

## 2021-12-23 LAB — HEPATIC FUNCTION PANEL
ALT: 14 U/L (ref 0–35)
AST: 18 U/L (ref 0–37)
Albumin: 4.3 g/dL (ref 3.5–5.2)
Alkaline Phosphatase: 66 U/L (ref 39–117)
Bilirubin, Direct: 0.1 mg/dL (ref 0.0–0.3)
Total Bilirubin: 0.5 mg/dL (ref 0.2–1.2)
Total Protein: 6.8 g/dL (ref 6.0–8.3)

## 2021-12-23 LAB — LIPID PANEL
Cholesterol: 210 mg/dL — ABNORMAL HIGH (ref 0–200)
HDL: 67 mg/dL (ref >39.00)
LDL Cholesterol: 129 mg/dL — ABNORMAL HIGH (ref 0–99)
NonHDL: 142.72
Total CHOL/HDL Ratio: 3
Triglycerides: 68 mg/dL (ref 0.0–149.0)
VLDL: 13.6 mg/dL (ref 0.0–40.0)

## 2021-12-23 LAB — VITAMIN B12: Vitamin B-12: 753 pg/mL (ref 211–911)

## 2021-12-23 LAB — TSH: TSH: 0.71 u[IU]/mL (ref 0.35–5.50)

## 2021-12-23 LAB — HEMOGLOBIN A1C: Hgb A1c MFr Bld: 5.8 % (ref 4.6–6.5)

## 2021-12-23 MED ORDER — ZOLPIDEM TARTRATE ER 12.5 MG PO TBCR: 12.5000 mg | EXTENDED_RELEASE_TABLET | Freq: Every evening | ORAL | 0 refills | Status: DC | PRN

## 2021-12-23 NOTE — Progress Notes (Signed)
   Subjective:    Patient ID: Tracey Sweeney, female    DOB: [birth year], 66 y.o.   MRN: [id number]  HPI Here for a well exam. She feels well but she is still struggling with sleep. We recently tried her on Zolpidem 10 mg at bedtime, and this helped her fall asleep. However she still woke up early.    Review of Systems  Constitutional: Negative.   HENT: Negative.    Eyes: Negative.   Respiratory: Negative.    Cardiovascular: Negative.   Gastrointestinal: Negative.   Genitourinary:  Negative for decreased urine volume, difficulty urinating, dyspareunia, dysuria, enuresis, flank pain, frequency, hematuria, pelvic pain and urgency.  Musculoskeletal: Negative.   Skin: Negative.   Neurological: Negative.  Negative for headaches.  Psychiatric/Behavioral:  Positive for sleep disturbance.        Objective:   Physical Exam Constitutional:      General: She is not in acute distress.    Appearance: Normal appearance. She is well-developed.  HENT:     Head: Normocephalic and atraumatic.     Right Ear: External ear normal.     Left Ear: External ear normal.     Nose: Nose normal.     Mouth/Throat:     Pharynx: No oropharyngeal exudate.  Eyes:     General: No scleral icterus.    Conjunctiva/sclera: Conjunctivae normal.     Pupils: Pupils are equal, round, and reactive to light.  Neck:     Thyroid: No thyromegaly.     Vascular: No JVD.  Cardiovascular:     Rate and Rhythm: Normal rate and regular rhythm.     Heart sounds: Normal heart sounds. No murmur heard.    No friction rub. No gallop.  Pulmonary:     Effort: Pulmonary effort is normal. No respiratory distress.     Breath sounds: Normal breath sounds. No wheezing or rales.  Chest:     Chest wall: No tenderness.  Abdominal:     General: Bowel sounds are normal. There is no distension.     Palpations: Abdomen is soft. There is no mass.     Tenderness: There is no abdominal tenderness. There is no guarding or rebound.   Musculoskeletal:        General: No tenderness. Normal range of motion.     Cervical back: Normal range of motion and neck supple.  Lymphadenopathy:     Cervical: No cervical adenopathy.  Skin:    General: Skin is warm and dry.     Findings: No erythema or rash.  Neurological:     Mental Status: She is alert and oriented to person, place, and time.     Cranial Nerves: No cranial nerve deficit.     Motor: No abnormal muscle tone.     Coordination: Coordination normal.     Deep Tendon Reflexes: Reflexes are normal and symmetric. Reflexes normal.  Psychiatric:        Behavior: Behavior normal.        Thought Content: Thought content normal.        Judgment: Judgment normal.           Assessment & Plan:  Well exam. We discussed diet and exercise. Get fasting labs. For sleep she will try Zolpidem CR 12.5 mg at bedtime.  Alysia Penna, MD

## 2021-12-29 ENCOUNTER — Encounter: Payer: Self-pay | Admitting: Family Medicine

## 2022-01-04 NOTE — Telephone Encounter (Signed)
Call in Trazodone 50 mg to take at bedtime, #30 with no rf. We can increase the dose if needed

## 2022-01-05 ENCOUNTER — Other Ambulatory Visit: Payer: Self-pay

## 2022-01-05 DIAGNOSIS — G4700 Insomnia, unspecified: Secondary | ICD-10-CM

## 2022-01-05 MED ORDER — TRAZODONE HCL 50 MG PO TABS: 50.0000 mg | ORAL_TABLET | Freq: Every day | ORAL | 0 refills | Status: DC

## 2022-01-05 NOTE — Progress Notes (Signed)
Message below was routed to provider.

## 2022-01-09 ENCOUNTER — Ambulatory Visit: Payer: BC Managed Care – PPO | Admitting: Family Medicine

## 2022-01-10 DIAGNOSIS — L821 Other seborrheic keratosis: Secondary | ICD-10-CM | POA: Diagnosis not present

## 2022-01-10 DIAGNOSIS — D1801 Hemangioma of skin and subcutaneous tissue: Secondary | ICD-10-CM | POA: Diagnosis not present

## 2022-01-10 DIAGNOSIS — D3613 Benign neoplasm of peripheral nerves and autonomic nervous system of lower limb, including hip: Secondary | ICD-10-CM | POA: Diagnosis not present

## 2022-01-12 DIAGNOSIS — J310 Chronic rhinitis: Secondary | ICD-10-CM | POA: Diagnosis not present

## 2022-01-12 DIAGNOSIS — J343 Hypertrophy of nasal turbinates: Secondary | ICD-10-CM | POA: Diagnosis not present

## 2022-01-12 DIAGNOSIS — H6981 Other specified disorders of Eustachian tube, right ear: Secondary | ICD-10-CM | POA: Diagnosis not present

## 2022-01-12 DIAGNOSIS — J342 Deviated nasal septum: Secondary | ICD-10-CM | POA: Diagnosis not present

## 2022-01-12 DIAGNOSIS — H9041 Sensorineural hearing loss, unilateral, right ear, with unrestricted hearing on the contralateral side: Secondary | ICD-10-CM | POA: Diagnosis not present

## 2022-01-12 DIAGNOSIS — R0982 Postnasal drip: Secondary | ICD-10-CM | POA: Diagnosis not present

## 2022-01-13 ENCOUNTER — Encounter: Payer: Self-pay | Admitting: Family Medicine

## 2022-01-13 MED ORDER — ESZOPICLONE 3 MG PO TABS
3.0000 mg | ORAL_TABLET | Freq: Every day | ORAL | 5 refills | Status: DC
Start: 2022-01-13 — End: 2022-08-30

## 2022-01-13 NOTE — Telephone Encounter (Signed)
Pt notified of medication changes.

## 2022-01-13 NOTE — Telephone Encounter (Signed)
Stop the Trazodone. I sent in some Lunesta for her to try

## 2022-01-19 ENCOUNTER — Other Ambulatory Visit: Payer: Self-pay | Admitting: Otolaryngology

## 2022-01-19 DIAGNOSIS — H918X3 Other specified hearing loss, bilateral: Secondary | ICD-10-CM

## 2022-01-19 DIAGNOSIS — H918X9 Other specified hearing loss, unspecified ear: Secondary | ICD-10-CM

## 2022-01-30 DIAGNOSIS — Z1231 Encounter for screening mammogram for malignant neoplasm of breast: Secondary | ICD-10-CM | POA: Diagnosis not present

## 2022-02-03 ENCOUNTER — Ambulatory Visit
Admission: RE | Admit: 2022-02-03 | Discharge: 2022-02-03 | Disposition: A | Discharge status: Home / Self Care | Payer: BC Managed Care – PPO | Source: Ambulatory Visit | Attending: Otolaryngology | Admitting: Otolaryngology

## 2022-02-03 DIAGNOSIS — J3489 Other specified disorders of nose and nasal sinuses: Secondary | ICD-10-CM | POA: Diagnosis not present

## 2022-02-03 DIAGNOSIS — H918X3 Other specified hearing loss, bilateral: Secondary | ICD-10-CM

## 2022-02-03 DIAGNOSIS — H9191 Unspecified hearing loss, right ear: Secondary | ICD-10-CM | POA: Diagnosis not present

## 2022-02-03 MED ORDER — GADOBENATE DIMEGLUMINE 529 MG/ML IV SOLN
12.0000 mL | Freq: Once | INTRAVENOUS | Status: AC | PRN
  Administered 2022-02-03: 12 mL via INTRAVENOUS

## 2022-02-06 ENCOUNTER — Other Ambulatory Visit: Payer: BC Managed Care – PPO

## 2022-02-23 DIAGNOSIS — J343 Hypertrophy of nasal turbinates: Secondary | ICD-10-CM | POA: Diagnosis not present

## 2022-02-23 DIAGNOSIS — H6981 Other specified disorders of Eustachian tube, right ear: Secondary | ICD-10-CM | POA: Diagnosis not present

## 2022-02-23 DIAGNOSIS — J342 Deviated nasal septum: Secondary | ICD-10-CM | POA: Diagnosis not present

## 2022-02-23 DIAGNOSIS — J310 Chronic rhinitis: Secondary | ICD-10-CM | POA: Diagnosis not present

## 2022-02-23 DIAGNOSIS — H9041 Sensorineural hearing loss, unilateral, right ear, with unrestricted hearing on the contralateral side: Secondary | ICD-10-CM | POA: Diagnosis not present

## 2022-02-23 DIAGNOSIS — R42 Dizziness and giddiness: Secondary | ICD-10-CM | POA: Diagnosis not present

## 2022-04-14 DIAGNOSIS — H26491 Other secondary cataract, right eye: Secondary | ICD-10-CM | POA: Diagnosis not present

## 2022-04-14 DIAGNOSIS — H26493 Other secondary cataract, bilateral: Secondary | ICD-10-CM | POA: Diagnosis not present

## 2022-04-18 DIAGNOSIS — M9903 Segmental and somatic dysfunction of lumbar region: Secondary | ICD-10-CM | POA: Diagnosis not present

## 2022-04-18 DIAGNOSIS — M5136 Other intervertebral disc degeneration, lumbar region: Secondary | ICD-10-CM | POA: Diagnosis not present

## 2022-04-18 DIAGNOSIS — M5412 Radiculopathy, cervical region: Secondary | ICD-10-CM | POA: Diagnosis not present

## 2022-04-18 DIAGNOSIS — M9901 Segmental and somatic dysfunction of cervical region: Secondary | ICD-10-CM | POA: Diagnosis not present

## 2022-04-20 DIAGNOSIS — M9903 Segmental and somatic dysfunction of lumbar region: Secondary | ICD-10-CM | POA: Diagnosis not present

## 2022-04-20 DIAGNOSIS — M5412 Radiculopathy, cervical region: Secondary | ICD-10-CM | POA: Diagnosis not present

## 2022-04-20 DIAGNOSIS — M9901 Segmental and somatic dysfunction of cervical region: Secondary | ICD-10-CM | POA: Diagnosis not present

## 2022-04-20 DIAGNOSIS — M5136 Other intervertebral disc degeneration, lumbar region: Secondary | ICD-10-CM | POA: Diagnosis not present

## 2022-05-02 DIAGNOSIS — M5412 Radiculopathy, cervical region: Secondary | ICD-10-CM | POA: Diagnosis not present

## 2022-05-02 DIAGNOSIS — M9903 Segmental and somatic dysfunction of lumbar region: Secondary | ICD-10-CM | POA: Diagnosis not present

## 2022-05-02 DIAGNOSIS — M9901 Segmental and somatic dysfunction of cervical region: Secondary | ICD-10-CM | POA: Diagnosis not present

## 2022-05-02 DIAGNOSIS — M5136 Other intervertebral disc degeneration, lumbar region: Secondary | ICD-10-CM | POA: Diagnosis not present

## 2022-05-03 DIAGNOSIS — M5412 Radiculopathy, cervical region: Secondary | ICD-10-CM | POA: Diagnosis not present

## 2022-05-03 DIAGNOSIS — M5136 Other intervertebral disc degeneration, lumbar region: Secondary | ICD-10-CM | POA: Diagnosis not present

## 2022-05-03 DIAGNOSIS — M9903 Segmental and somatic dysfunction of lumbar region: Secondary | ICD-10-CM | POA: Diagnosis not present

## 2022-05-03 DIAGNOSIS — M9901 Segmental and somatic dysfunction of cervical region: Secondary | ICD-10-CM | POA: Diagnosis not present

## 2022-05-09 DIAGNOSIS — M5136 Other intervertebral disc degeneration, lumbar region: Secondary | ICD-10-CM | POA: Diagnosis not present

## 2022-05-09 DIAGNOSIS — M9903 Segmental and somatic dysfunction of lumbar region: Secondary | ICD-10-CM | POA: Diagnosis not present

## 2022-05-09 DIAGNOSIS — M5412 Radiculopathy, cervical region: Secondary | ICD-10-CM | POA: Diagnosis not present

## 2022-05-09 DIAGNOSIS — M9901 Segmental and somatic dysfunction of cervical region: Secondary | ICD-10-CM | POA: Diagnosis not present

## 2022-05-11 DIAGNOSIS — M9901 Segmental and somatic dysfunction of cervical region: Secondary | ICD-10-CM | POA: Diagnosis not present

## 2022-05-11 DIAGNOSIS — M5136 Other intervertebral disc degeneration, lumbar region: Secondary | ICD-10-CM | POA: Diagnosis not present

## 2022-05-11 DIAGNOSIS — M9903 Segmental and somatic dysfunction of lumbar region: Secondary | ICD-10-CM | POA: Diagnosis not present

## 2022-05-11 DIAGNOSIS — M5412 Radiculopathy, cervical region: Secondary | ICD-10-CM | POA: Diagnosis not present

## 2022-05-15 DIAGNOSIS — M5412 Radiculopathy, cervical region: Secondary | ICD-10-CM | POA: Diagnosis not present

## 2022-05-15 DIAGNOSIS — M9903 Segmental and somatic dysfunction of lumbar region: Secondary | ICD-10-CM | POA: Diagnosis not present

## 2022-05-15 DIAGNOSIS — M5136 Other intervertebral disc degeneration, lumbar region: Secondary | ICD-10-CM | POA: Diagnosis not present

## 2022-05-15 DIAGNOSIS — M9901 Segmental and somatic dysfunction of cervical region: Secondary | ICD-10-CM | POA: Diagnosis not present

## 2022-05-17 DIAGNOSIS — M5136 Other intervertebral disc degeneration, lumbar region: Secondary | ICD-10-CM | POA: Diagnosis not present

## 2022-05-17 DIAGNOSIS — M9901 Segmental and somatic dysfunction of cervical region: Secondary | ICD-10-CM | POA: Diagnosis not present

## 2022-05-17 DIAGNOSIS — M5412 Radiculopathy, cervical region: Secondary | ICD-10-CM | POA: Diagnosis not present

## 2022-05-17 DIAGNOSIS — M9903 Segmental and somatic dysfunction of lumbar region: Secondary | ICD-10-CM | POA: Diagnosis not present

## 2022-05-24 DIAGNOSIS — M5412 Radiculopathy, cervical region: Secondary | ICD-10-CM | POA: Diagnosis not present

## 2022-05-24 DIAGNOSIS — M9903 Segmental and somatic dysfunction of lumbar region: Secondary | ICD-10-CM | POA: Diagnosis not present

## 2022-05-24 DIAGNOSIS — M9901 Segmental and somatic dysfunction of cervical region: Secondary | ICD-10-CM | POA: Diagnosis not present

## 2022-05-24 DIAGNOSIS — M5136 Other intervertebral disc degeneration, lumbar region: Secondary | ICD-10-CM | POA: Diagnosis not present

## 2022-05-29 DIAGNOSIS — M9901 Segmental and somatic dysfunction of cervical region: Secondary | ICD-10-CM | POA: Diagnosis not present

## 2022-05-29 DIAGNOSIS — M9903 Segmental and somatic dysfunction of lumbar region: Secondary | ICD-10-CM | POA: Diagnosis not present

## 2022-05-29 DIAGNOSIS — M5136 Other intervertebral disc degeneration, lumbar region: Secondary | ICD-10-CM | POA: Diagnosis not present

## 2022-05-29 DIAGNOSIS — M5412 Radiculopathy, cervical region: Secondary | ICD-10-CM | POA: Diagnosis not present

## 2022-05-31 DIAGNOSIS — M9901 Segmental and somatic dysfunction of cervical region: Secondary | ICD-10-CM | POA: Diagnosis not present

## 2022-05-31 DIAGNOSIS — M9903 Segmental and somatic dysfunction of lumbar region: Secondary | ICD-10-CM | POA: Diagnosis not present

## 2022-05-31 DIAGNOSIS — M5412 Radiculopathy, cervical region: Secondary | ICD-10-CM | POA: Diagnosis not present

## 2022-05-31 DIAGNOSIS — M5136 Other intervertebral disc degeneration, lumbar region: Secondary | ICD-10-CM | POA: Diagnosis not present

## 2022-06-05 ENCOUNTER — Encounter: Payer: Self-pay | Admitting: Family Medicine

## 2022-06-05 NOTE — Telephone Encounter (Signed)
I usually recommend seeing the hearing aid center at [facility]. If she does get hearing aids, she can save a lot of money buying them through [employer]

## 2022-06-07 DIAGNOSIS — M5136 Other intervertebral disc degeneration, lumbar region: Secondary | ICD-10-CM | POA: Diagnosis not present

## 2022-06-07 DIAGNOSIS — M9903 Segmental and somatic dysfunction of lumbar region: Secondary | ICD-10-CM | POA: Diagnosis not present

## 2022-06-07 DIAGNOSIS — M9901 Segmental and somatic dysfunction of cervical region: Secondary | ICD-10-CM | POA: Diagnosis not present

## 2022-06-07 DIAGNOSIS — M5412 Radiculopathy, cervical region: Secondary | ICD-10-CM | POA: Diagnosis not present

## 2022-06-14 DIAGNOSIS — M9901 Segmental and somatic dysfunction of cervical region: Secondary | ICD-10-CM | POA: Diagnosis not present

## 2022-06-14 DIAGNOSIS — M5412 Radiculopathy, cervical region: Secondary | ICD-10-CM | POA: Diagnosis not present

## 2022-06-14 DIAGNOSIS — M5136 Other intervertebral disc degeneration, lumbar region: Secondary | ICD-10-CM | POA: Diagnosis not present

## 2022-06-14 DIAGNOSIS — M9903 Segmental and somatic dysfunction of lumbar region: Secondary | ICD-10-CM | POA: Diagnosis not present

## 2022-06-19 DIAGNOSIS — Z87442 Personal history of urinary calculi: Secondary | ICD-10-CM | POA: Diagnosis not present

## 2022-06-19 DIAGNOSIS — N201 Calculus of ureter: Secondary | ICD-10-CM | POA: Diagnosis not present

## 2022-06-19 DIAGNOSIS — N200 Calculus of kidney: Secondary | ICD-10-CM | POA: Diagnosis not present

## 2022-08-12 IMAGING — DX DG RIBS W/ CHEST 3+V*R*
3 series · 3 of 3 positions shown · non-contrast
Comparison: None.

CLINICAL DATA: Fall, rib pain right upper chest.

EXAM:
RIGHT RIBS AND CHEST - 3+ VIEW

[chest pa]
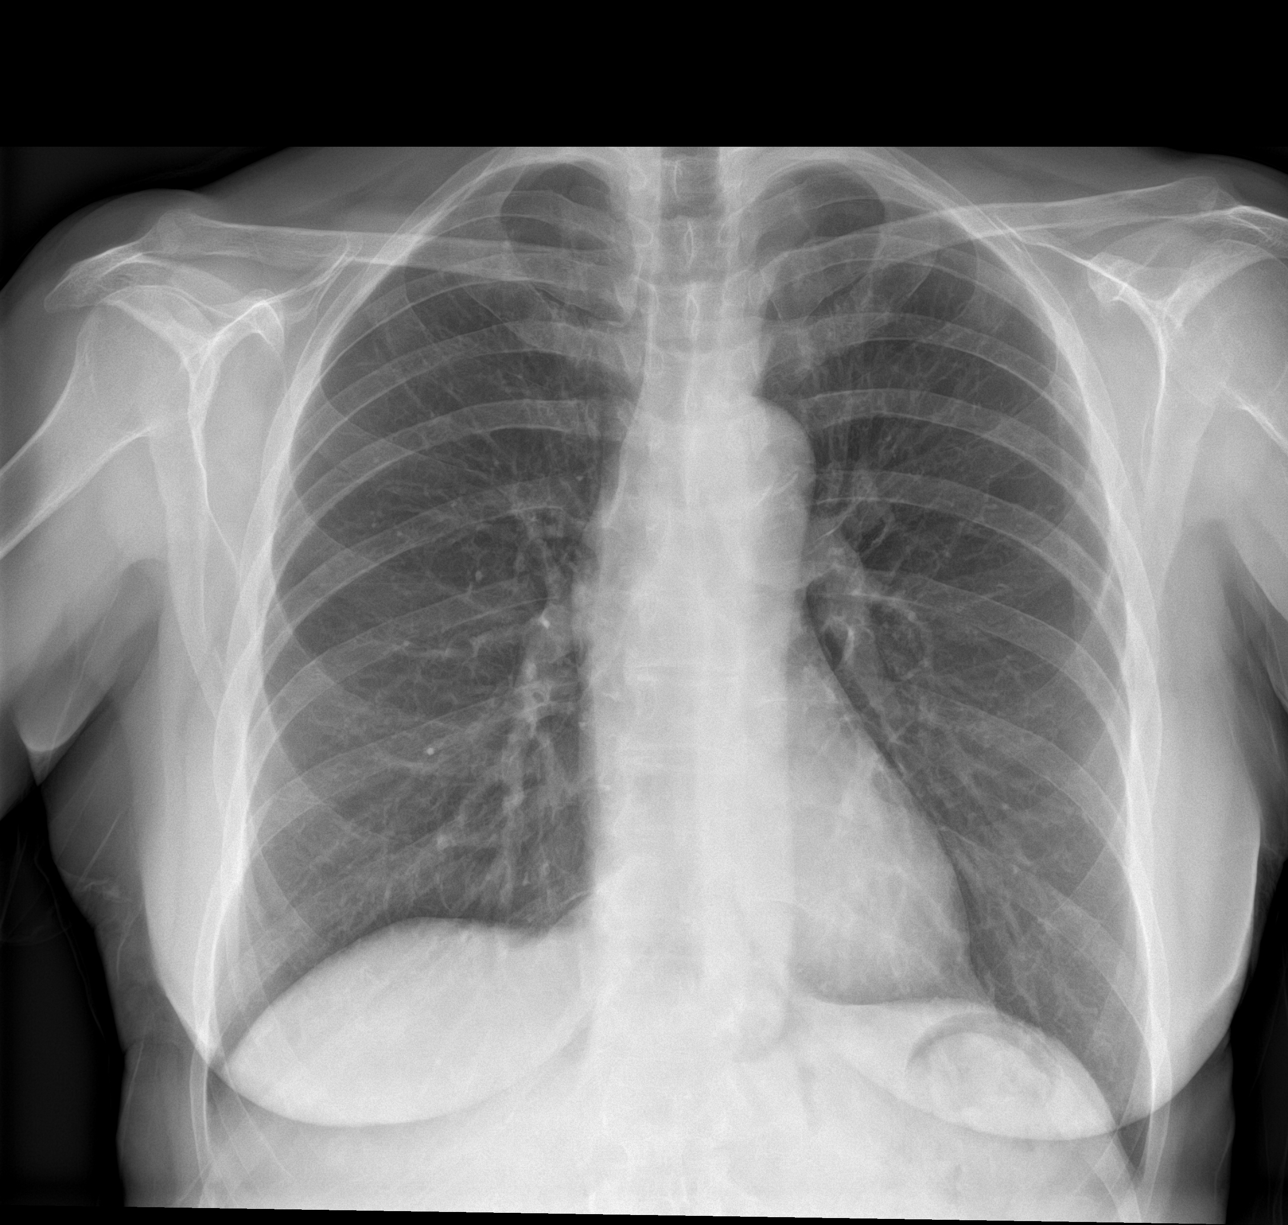

[rib pa]
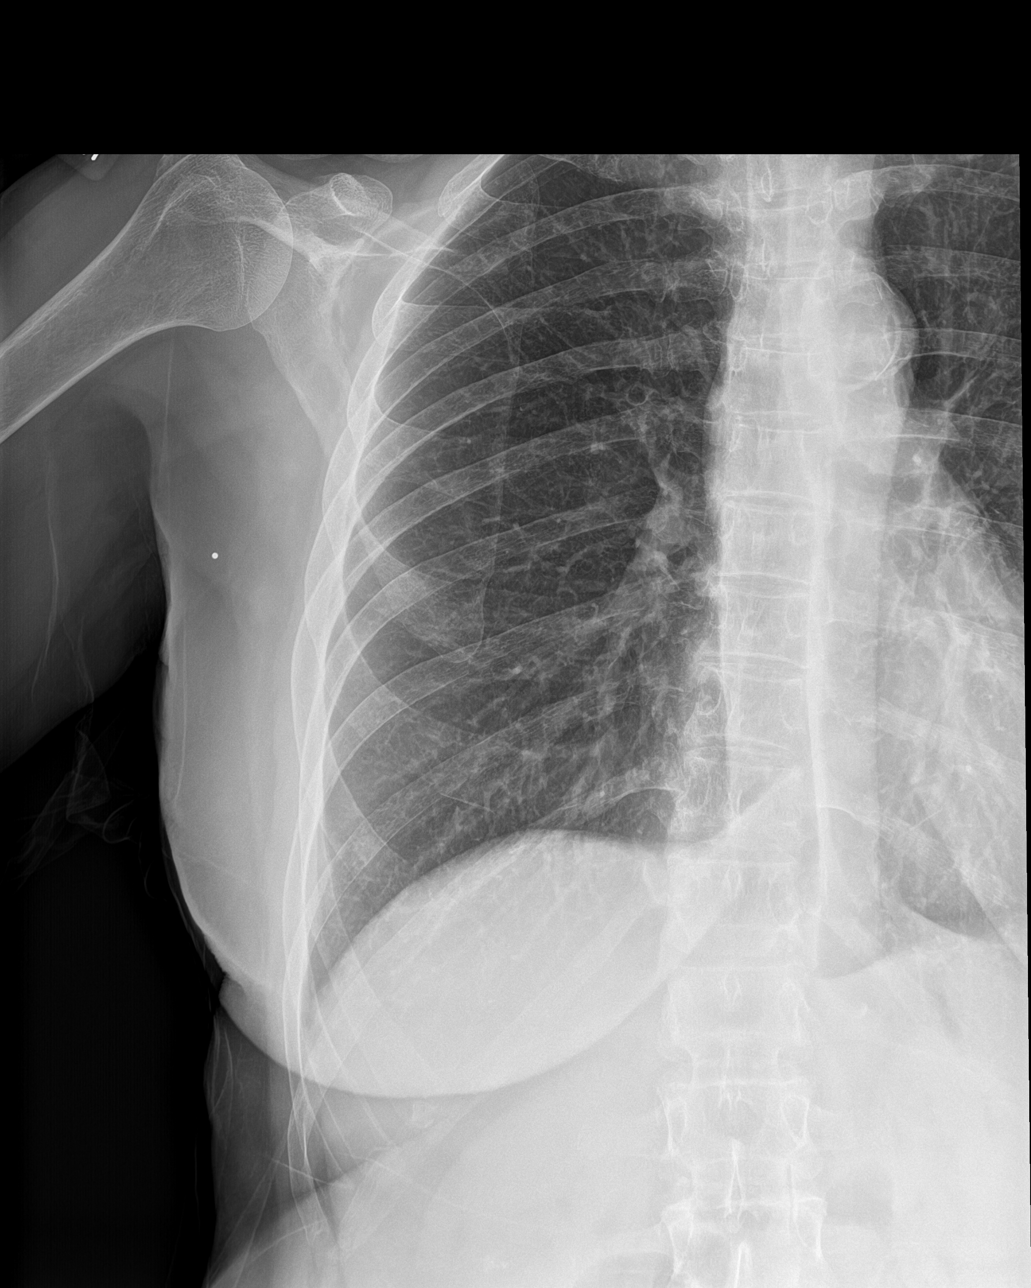

[rib obl]
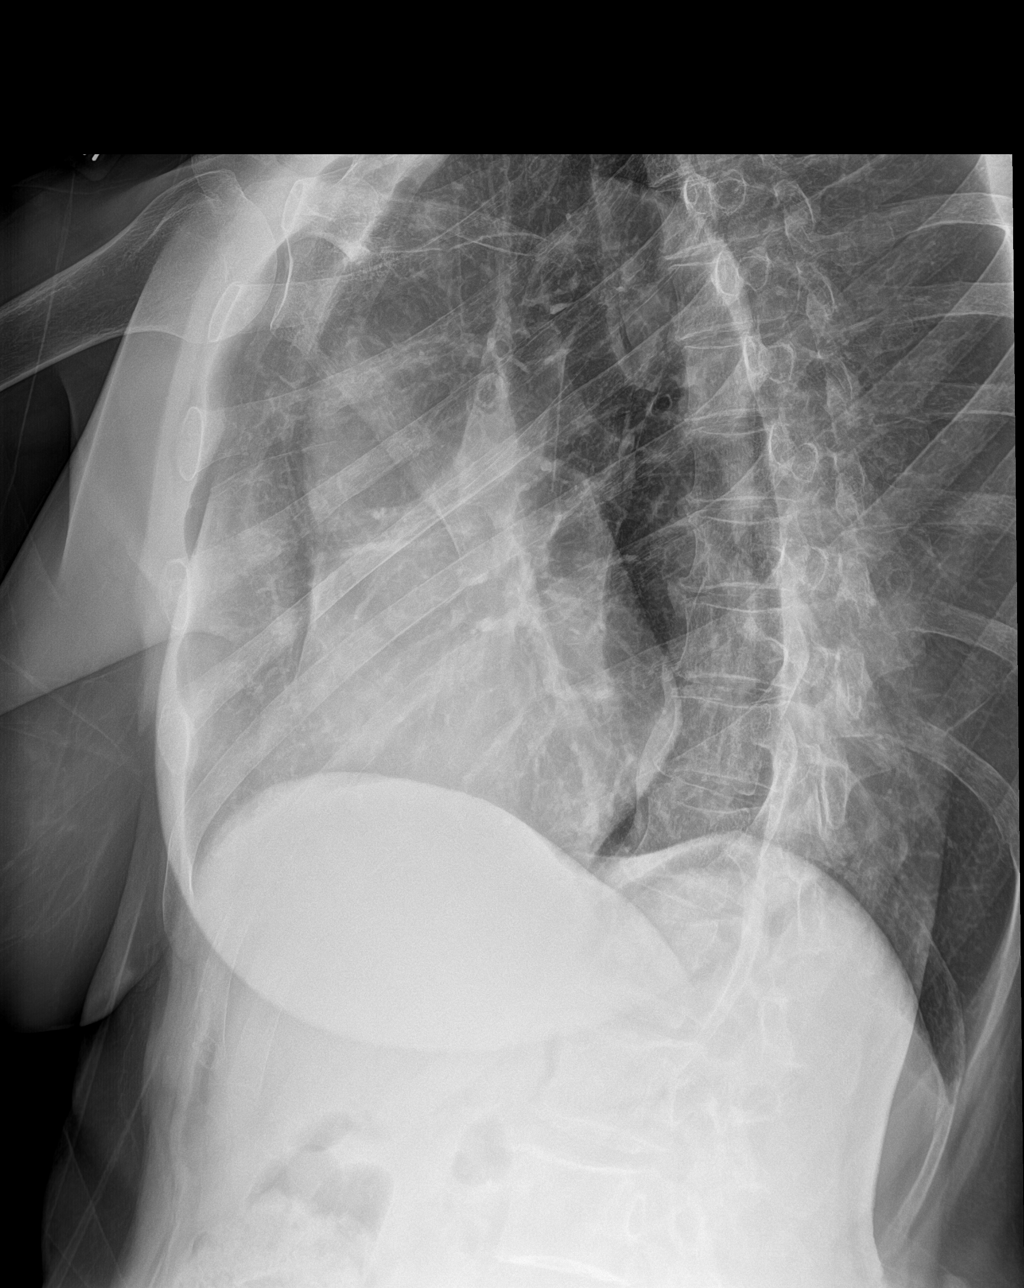

[3 of 3 positions shown; findings below may reference images not displayed]

FINDINGS: No fracture or other bone lesions are seen involving the ribs. There
is no evidence of pneumothorax or pleural effusion. Both lungs are
clear. Heart size and mediastinal contours are within normal limits.
IMPRESSION: Negative.

## 2022-08-16 ENCOUNTER — Ambulatory Visit: Payer: BC Managed Care – PPO | Admitting: Family Medicine

## 2022-08-30 ENCOUNTER — Encounter: Payer: Self-pay | Admitting: Family Medicine

## 2022-08-30 ENCOUNTER — Ambulatory Visit (INDEPENDENT_AMBULATORY_CARE_PROVIDER_SITE_OTHER): Payer: Medicare HMO | Admitting: Family Medicine

## 2022-08-30 VITALS — BP 118/70 | HR 88 | Temp 98.3°F | Wt 130.8 lb

## 2022-08-30 DIAGNOSIS — M25561 Pain in right knee: Secondary | ICD-10-CM

## 2022-08-30 DIAGNOSIS — M67441 Ganglion, right hand: Secondary | ICD-10-CM | POA: Diagnosis not present

## 2022-08-30 DIAGNOSIS — R2231 Localized swelling, mass and lump, right upper limb: Secondary | ICD-10-CM

## 2022-08-30 MED ORDER — ZOLPIDEM TARTRATE 10 MG PO TABS: 10.0000 mg | ORAL_TABLET | Freq: Every evening | ORAL | 5 refills | Status: DC | PRN

## 2022-08-30 MED ORDER — METHYLPREDNISOLONE 4 MG PO TBPK: ORAL_TABLET | ORAL | 0 refills | Status: DC

## 2022-08-30 NOTE — Progress Notes (Signed)
   Subjective:    Patient ID: Tracey Sweeney, female    DOB: 11/29/1955, 67 y.o.   MRN: [id number]  HPI Here for several issues. First 4 days ago she had the sudden onset of a sharp pain in the medial right knee. No hx of trauma, but she does walk her dog several times a day. She denies any swelling or redness or warmth. She has tried applying ice and taking Tylenol with little benefit. The other issues involve her right hand. For the past 6 months she has noticed a small lump in the right palm at  the base of her index finger. Sometimes this is painful but not always. Then 2 months ago she noticed a painful lump in the right thumb.    Review of Systems  Constitutional: Negative.   Respiratory: Negative.    Cardiovascular: Negative.   Musculoskeletal:  Positive for arthralgias.       Objective:   Physical Exam Constitutional:      Comments: In pain, limping   Cardiovascular:     Rate and Rhythm: Normal rate and regular rhythm.     Pulses: Normal pulses.     Heart sounds: Normal heart sounds.  Pulmonary:     Effort: Pulmonary effort is normal.     Breath sounds: Normal breath sounds.  Musculoskeletal:     Comments: The right knee appears normal. She is very tender in medial joint space. ROM is limited by pain. No crepitus. Negative McMurrays. In the right hand, there is a BB-sized mobile tender lump at the base of the thumb flexor tendon. There is also a non-tender mobile mass in the palm at the base of the index finger.   Neurological:     Mental Status: She is alert.           Assessment & Plan:  She likely has a tear in the right medial meniscus. She will continue to apply ice, and I advised her to wear an elastic support sleeve. She will stay off the leg as much as possible. She is given a Medrol dose pack. She also has a right palmar nodule and a ganglion cyst on the right thumb. She has seen Dr. Merlyn Lot in the past, so she will make an appt with him for these issues.  Gershon Crane, MD

## 2022-08-31 DIAGNOSIS — J3089 Other allergic rhinitis: Secondary | ICD-10-CM | POA: Diagnosis not present

## 2022-09-06 ENCOUNTER — Encounter: Payer: Self-pay | Admitting: Family Medicine

## 2022-09-06 DIAGNOSIS — M25561 Pain in right knee: Secondary | ICD-10-CM

## 2022-09-08 NOTE — Telephone Encounter (Signed)
I did the referral 

## 2022-09-20 ENCOUNTER — Ambulatory Visit (INDEPENDENT_AMBULATORY_CARE_PROVIDER_SITE_OTHER): Payer: Medicare HMO

## 2022-09-20 ENCOUNTER — Ambulatory Visit (INDEPENDENT_AMBULATORY_CARE_PROVIDER_SITE_OTHER): Payer: Medicare HMO | Admitting: Orthopaedic Surgery

## 2022-09-20 DIAGNOSIS — M25561 Pain in right knee: Secondary | ICD-10-CM

## 2022-09-20 DIAGNOSIS — S838X1A Sprain of other specified parts of right knee, initial encounter: Secondary | ICD-10-CM | POA: Diagnosis not present

## 2022-09-20 NOTE — Progress Notes (Signed)
Chief Complaint: Right knee pain     History of Present Illness:    Tracey Sweeney is a 67 y.o. female presents today with ongoing right knee pain after recently walking significant portion.  She does have a chocolate lab at home which she enjoys walking with significantly.  She does walk for exercise.  She states the last several days she woke up and there has been focal right knee tenderness over the medial joint space.  This is limiting her ability to walk.  She has been altering her gait as result.  There is been swelling in the knee.  She is here today for further discussion    Surgical History:   None  PMH/PSH/Family History/Social History/Meds/Allergies:    Past Medical History:  Diagnosis Date   Allergy    Arthritis    has an autoimmune disease   Cataract    Bil surgery   GERD (gastroesophageal reflux disease)    Insomnia    Kidney stones dec 2014   sees Dr. Georgette Shell with [facility]    Varicose veins of legs    Past Surgical History:  Procedure Laterality Date   CATARACT EXTRACTION     Bil   cervix laser surgery     COLONOSCOPY  12/16/2018   per Dr. Wendall Papa, adenomatous polyps, repeat in 5 yrs    COLONOSCOPY     KIDNEY STONE SURGERY     had 2 stents put in   LITHOTRIPSY  2014   POLYPECTOMY     TONSILLECTOMY     varicose veins     Dr. Donia Ast   Social History   Socioeconomic History   Marital status: Single    Spouse name: Not on file   Number of children: Not on file   Years of education: Not on file   Highest education level: Bachelor's degree (e.g., BA, AB, BS)  Occupational History   Not on file  Tobacco Use   Smoking status: Never   Smokeless tobacco: Never  Substance and Sexual Activity   Alcohol use: No    Alcohol/week: 0.0 standard drinks of alcohol   Drug use: No   Sexual activity: Not on file  Other Topics Concern   Not on file  Social History Narrative   Not on file   Social Determinants of  Health   Financial Resource Strain: Low Risk  (08/29/2022)   Overall Financial Resource Strain (CARDIA)    Difficulty of Paying Living Expenses: Not hard at all  Food Insecurity: No Food Insecurity (08/29/2022)   Hunger Vital Sign    Worried About Running Out of Food in the Last Year: Never true    Ran Out of Food in the Last Year: Never true  Transportation Needs: No Transportation Needs (08/29/2022)   PRAPARE - Administrator, Civil Service (Medical): No    Lack of Transportation (Non-Medical): No  Physical Activity: Sufficiently Active (08/29/2022)   Exercise Vital Sign    Days of Exercise per Week: 7 days    Minutes of Exercise per Session: 90 min  Stress: Stress Concern Present (08/29/2022)   [employer] of Occupational Health - Occupational Stress Questionnaire    Feeling of Stress : To some extent  Social Connections: Unknown (08/29/2022)   Social Connection and Isolation Panel [NHANES]  Frequency of Communication with Friends and Family: More than three times a week    Frequency of Social Gatherings with Friends and Family: More than three times a week    Attends Religious Services: Patient declined    Database administrator or Organizations: No    Attends Engineer, structural: Not on file    Marital Status: Never married   Family History  Problem Relation Age of Onset   Cancer Other        skin, bladder, & breast   Arthritis Other    Diabetes Other    Hyperlipidemia Other    Hypertension Other    Kidney disease Other    Bladder Cancer Father    Melanoma Father    Diabetes Father    Melanoma Paternal Aunt    Heart disease Mother    Hypertension Mother    Colon cancer Sister    Emphysema Brother    Colon cancer Cousin    Allergies  Allergen Reactions   Etodolac     Made stomach bleed   Diclofenac Sodium Other (See Comments)    Pt unsure of reaction   Penicillins     REACTION: Hives   Sulfonamide Derivatives     REACTION:  Nausea/vomiting   Tetracyclines & Related     Syncope    Current Outpatient Medications  Medication Sig Dispense Refill   CITRUS BERGAMOT PO Take by mouth daily at 2 PM.     desloratadine (CLARINEX) 5 MG tablet TAKE 1 TABLET BY MOUTH EVERY DAY FOR 30 DAYS     Guaifenesin 1200 MG TB12 Take 1 tablet by mouth 2 (two) times daily.      methylPREDNISolone (MEDROL DOSEPAK) 4 MG TBPK tablet As directed 21 tablet 0   Omega-3 Fatty Acids (FISH OIL) 1200 MG CAPS Take by mouth daily.     zolpidem (AMBIEN) 10 MG tablet Take 1 tablet (10 mg total) by mouth at bedtime as needed for sleep. 30 tablet 5   Current Facility-Administered Medications  Medication Dose Route Frequency Provider Last Rate Last Admin   triamcinolone acetonide (KENALOG) 10 MG/ML injection 10 mg  10 mg Other Once Vivi Barrack, DPM       No results found.  Review of Systems:   A ROS was performed including pertinent positives and negatives as documented in the HPI.  Physical Exam :   Constitutional: NAD and appears stated age Neurological: Alert and oriented Psych: Appropriate affect and cooperative There were no vitals taken for this visit.   Comprehensive Musculoskeletal Exam:      Musculoskeletal Exam  Gait Normal  Alignment Normal   Right Left  Inspection Normal Normal  Palpation    Tenderness Medial joint line None  Crepitus None None  Effusion Mild None  Range of Motion    Extension 0 0  Flexion 135 135  Strength    Extension 5/5 5/5  Flexion 5/5 5/5  Ligament Exam     Generalized Laxity No No  Lachman Negative Negative   Pivot Shift Negative Negative  Anterior Drawer Negative Negative  Valgus at 0 Negative Negative  Valgus at 20 Negative Negative  Varus at 0 0 0  Varus at 20   0 0  Posterior Drawer at 90 0 0  Vascular/Lymphatic Exam    Edema None None  Venous Stasis Changes No No  Distal Circulation Normal Normal  Neurologic    Light Touch Sensation Intact Intact  Special Tests: Positive  McMurray  medially     Imaging:   Xray (4 views right knee): Normal    I personally reviewed and interpreted the radiographs.   Assessment:   67 y.o. female with right knee medial based knee pain which has been swollen with inability to put full weight on it.  Given this I would like to recommend an acute MRI at this time as I do have a suspicion for an acute meniscal root tear which would be potentially surgical.  Will plan to obtain an MRI of the right knee and I will follow-up after discuss results  Plan :    -Plan for MRI right knee and follow-up to discuss results     I personally saw and evaluated the patient, and participated in the management and treatment plan.  Huel Cote, MD Attending Physician, Orthopedic Surgery  This document was dictated using Dragon voice recognition software. A reasonable attempt at proof reading has been made to minimize errors.

## 2022-09-21 ENCOUNTER — Encounter: Payer: Self-pay | Admitting: Family Medicine

## 2022-09-21 MED ORDER — DESLORATADINE 5 MG PO TABS: 5.0000 mg | ORAL_TABLET | Freq: Every day | ORAL | 3 refills | Status: DC

## 2022-09-21 NOTE — Telephone Encounter (Signed)
Done

## 2022-09-21 NOTE — Telephone Encounter (Signed)
Please advise 

## 2022-09-28 ENCOUNTER — Ambulatory Visit
Admission: RE | Admit: 2022-09-28 | Discharge: 2022-09-28 | Disposition: A | Discharge status: Home / Self Care | Payer: Medicare HMO | Source: Ambulatory Visit | Attending: Orthopaedic Surgery | Admitting: Orthopaedic Surgery

## 2022-09-28 ENCOUNTER — Other Ambulatory Visit: Payer: Medicare HMO

## 2022-09-28 DIAGNOSIS — M25561 Pain in right knee: Secondary | ICD-10-CM | POA: Diagnosis not present

## 2022-09-28 DIAGNOSIS — S838X1A Sprain of other specified parts of right knee, initial encounter: Secondary | ICD-10-CM

## 2022-10-18 ENCOUNTER — Ambulatory Visit (HOSPITAL_BASED_OUTPATIENT_CLINIC_OR_DEPARTMENT_OTHER): Payer: Medicare HMO | Admitting: Orthopaedic Surgery

## 2022-10-18 DIAGNOSIS — S838X1A Sprain of other specified parts of right knee, initial encounter: Secondary | ICD-10-CM

## 2022-10-18 NOTE — Progress Notes (Signed)
Chief Complaint: Right knee pain     History of Present Illness:   10/18/2022: Presents today for MRI discussion of her right knee.  Overall she is feeling much better at today's visit.  Tracey Sweeney is a 67 y.o. female presents today with ongoing right knee pain after recently walking significant portion.  She does have a chocolate lab at home which she enjoys walking with significantly.  She does walk for exercise.  She states the last several days she woke up and there has been focal right knee tenderness over the medial joint space.  This is limiting her ability to walk.  She has been altering her gait as result.  There is been swelling in the knee.  She is here today for further discussion    Surgical History:   None  PMH/PSH/Family History/Social History/Meds/Allergies:    Past Medical History:  Diagnosis Date   Allergy    Arthritis    has an autoimmune disease   Cataract    Bil surgery   GERD (gastroesophageal reflux disease)    Insomnia    Kidney stones dec 2014   sees Dr. Georgette Shell with [facility]    Varicose veins of legs    Past Surgical History:  Procedure Laterality Date   CATARACT EXTRACTION     Bil   cervix laser surgery     COLONOSCOPY  12/16/2018   per Dr. Wendall Papa, adenomatous polyps, repeat in 5 yrs    COLONOSCOPY     KIDNEY STONE SURGERY     had 2 stents put in   LITHOTRIPSY  2014   POLYPECTOMY     TONSILLECTOMY     varicose veins     Dr. Donia Ast   Social History   Socioeconomic History   Marital status: Single    Spouse name: Not on file   Number of children: Not on file   Years of education: Not on file   Highest education level: Bachelor's degree (e.g., BA, AB, BS)  Occupational History   Not on file  Tobacco Use   Smoking status: Never   Smokeless tobacco: Never  Substance and Sexual Activity   Alcohol use: No    Alcohol/week: 0.0 standard drinks of alcohol   Drug use: No   Sexual activity:  Not on file  Other Topics Concern   Not on file  Social History Narrative   Not on file   Social Determinants of Health   Financial Resource Strain: Low Risk  (08/29/2022)   Overall Financial Resource Strain (CARDIA)    Difficulty of Paying Living Expenses: Not hard at all  Food Insecurity: No Food Insecurity (08/29/2022)   Hunger Vital Sign    Worried About Running Out of Food in the Last Year: Never true    Ran Out of Food in the Last Year: Never true  Transportation Needs: No Transportation Needs (08/29/2022)   PRAPARE - Administrator, Civil Service (Medical): No    Lack of Transportation (Non-Medical): No  Physical Activity: Sufficiently Active (08/29/2022)   Exercise Vital Sign    Days of Exercise per Week: 7 days    Minutes of Exercise per Session: 90 min  Stress: Stress Concern Present (08/29/2022)   [employer] of Occupational Health - Occupational Stress Questionnaire  Feeling of Stress : To some extent  Social Connections: Unknown (08/29/2022)   Social Connection and Isolation Panel [NHANES]    Frequency of Communication with Friends and Family: More than three times a week    Frequency of Social Gatherings with Friends and Family: More than three times a week    Attends Religious Services: Patient declined    Database administrator or Organizations: No    Attends Engineer, structural: Not on file    Marital Status: Never married   Family History  Problem Relation Age of Onset   Cancer Other        skin, bladder, & breast   Arthritis Other    Diabetes Other    Hyperlipidemia Other    Hypertension Other    Kidney disease Other    Bladder Cancer Father    Melanoma Father    Diabetes Father    Melanoma Paternal Aunt    Heart disease Mother    Hypertension Mother    Colon cancer Sister    Emphysema Brother    Colon cancer Cousin    Allergies  Allergen Reactions   Etodolac     Made stomach bleed   Diclofenac Sodium Other (See  Comments)    Pt unsure of reaction   Penicillins     REACTION: Hives   Sulfonamide Derivatives     REACTION: Nausea/vomiting   Tetracyclines & Related     Syncope    Current Outpatient Medications  Medication Sig Dispense Refill   CITRUS BERGAMOT PO Take by mouth daily at 2 PM.     desloratadine (CLARINEX) 5 MG tablet Take 1 tablet (5 mg total) by mouth daily. 90 tablet 3   Guaifenesin 1200 MG TB12 Take 1 tablet by mouth 2 (two) times daily.      Omega-3 Fatty Acids (FISH OIL) 1200 MG CAPS Take by mouth daily.     zolpidem (AMBIEN) 10 MG tablet Take 1 tablet (10 mg total) by mouth at bedtime as needed for sleep. 30 tablet 5   Current Facility-Administered Medications  Medication Dose Route Frequency Provider Last Rate Last Admin   triamcinolone acetonide (KENALOG) 10 MG/ML injection 10 mg  10 mg Other Once Vivi Barrack, DPM       No results found.  Review of Systems:   A ROS was performed including pertinent positives and negatives as documented in the HPI.  Physical Exam :   Constitutional: NAD and appears stated age Neurological: Alert and oriented Psych: Appropriate affect and cooperative There were no vitals taken for this visit.   Comprehensive Musculoskeletal Exam:      Musculoskeletal Exam  Gait Normal  Alignment Normal   Right Left  Inspection Normal Normal  Palpation    Tenderness Medial joint line None  Crepitus None None  Effusion Mild None  Range of Motion    Extension 0 0  Flexion 135 135  Strength    Extension 5/5 5/5  Flexion 5/5 5/5  Ligament Exam     Generalized Laxity No No  Lachman Negative Negative   Pivot Shift Negative Negative  Anterior Drawer Negative Negative  Valgus at 0 Negative Negative  Valgus at 20 Negative Negative  Varus at 0 0 0  Varus at 20   0 0  Posterior Drawer at 90 0 0  Vascular/Lymphatic Exam    Edema None None  Venous Stasis Changes No No  Distal Circulation Normal Normal  Neurologic    Light Touch  Sensation Intact Intact  Special Tests: Positive McMurray medially     Imaging:   Xray (4 views right knee): Normal   MRI right knee: Normal I personally reviewed and interpreted the radiographs.   Assessment:   67 y.o. female with right right knee pain that is now resolved.  I did describe that she does have some very mild medial compartment degenerative findings.  I believe that this likely was a very mild flareup of her osteoarthritis.  I have discussed that she may come back and see me as needed for an injection of this knee and considering knee sleeve to help with swelling.  I will plan to see her back as needed Plan :    -Return to clinic as needed     I personally saw and evaluated the patient, and participated in the management and treatment plan.  Huel Cote, MD Attending Physician, Orthopedic Surgery  This document was dictated using Dragon voice recognition software. A reasonable attempt at proof reading has been made to minimize errors.

## 2022-11-24 DIAGNOSIS — M65321 Trigger finger, right index finger: Secondary | ICD-10-CM | POA: Diagnosis not present

## 2022-11-24 DIAGNOSIS — M65311 Trigger thumb, right thumb: Secondary | ICD-10-CM | POA: Diagnosis not present

## 2022-11-28 ENCOUNTER — Other Ambulatory Visit (HOSPITAL_COMMUNITY): Payer: Self-pay

## 2022-11-28 ENCOUNTER — Telehealth: Payer: Self-pay

## 2022-11-28 NOTE — Telephone Encounter (Signed)
PA has been DENIED for Zolpidem due to:  Full denial letter has been attached in patients media

## 2022-12-01 NOTE — Telephone Encounter (Signed)
Noted  

## 2022-12-05 ENCOUNTER — Encounter: Payer: Self-pay | Admitting: Family Medicine

## 2022-12-05 ENCOUNTER — Ambulatory Visit (INDEPENDENT_AMBULATORY_CARE_PROVIDER_SITE_OTHER): Payer: Medicare HMO | Admitting: Family Medicine

## 2022-12-05 VITALS — BP 118/72 | HR 78 | Temp 98.4°F | Wt 137.0 lb

## 2022-12-05 DIAGNOSIS — E538 Deficiency of other specified B group vitamins: Secondary | ICD-10-CM | POA: Diagnosis not present

## 2022-12-05 DIAGNOSIS — E559 Vitamin D deficiency, unspecified: Secondary | ICD-10-CM

## 2022-12-05 DIAGNOSIS — G4730 Sleep apnea, unspecified: Secondary | ICD-10-CM

## 2022-12-05 DIAGNOSIS — E785 Hyperlipidemia, unspecified: Secondary | ICD-10-CM | POA: Diagnosis not present

## 2022-12-05 DIAGNOSIS — R739 Hyperglycemia, unspecified: Secondary | ICD-10-CM | POA: Diagnosis not present

## 2022-12-05 DIAGNOSIS — R5383 Other fatigue: Secondary | ICD-10-CM | POA: Diagnosis not present

## 2022-12-05 LAB — CBC WITH DIFFERENTIAL/PLATELET
Basophils Absolute: 0.1 10*3/uL (ref 0.0–0.1)
Basophils Relative: 0.8 % (ref 0.0–3.0)
Eosinophils Absolute: 0.1 10*3/uL (ref 0.0–0.7)
Eosinophils Relative: 1.2 % (ref 0.0–5.0)
HCT: 42.2 % (ref 36.0–46.0)
Hemoglobin: 14 g/dL (ref 12.0–15.0)
Lymphocytes Relative: 33.4 % (ref 12.0–46.0)
Lymphs Abs: 2.4 10*3/uL (ref 0.7–4.0)
MCHC: 33.3 g/dL (ref 30.0–36.0)
MCV: 90.4 fl (ref 78.0–100.0)
Monocytes Absolute: 0.6 10*3/uL (ref 0.1–1.0)
Monocytes Relative: 7.7 % (ref 3.0–12.0)
Neutro Abs: 4.1 10*3/uL (ref 1.4–7.7)
Neutrophils Relative %: 56.9 % (ref 43.0–77.0)
Platelets: 227 10*3/uL (ref 150.0–400.0)
RBC: 4.66 Mil/uL (ref 3.87–5.11)
RDW: 13.7 % (ref 11.5–15.5)
WBC: 7.2 10*3/uL (ref 4.0–10.5)

## 2022-12-05 LAB — BASIC METABOLIC PANEL
BUN: 13 mg/dL (ref 6–23)
CO2: 28 mEq/L (ref 19–32)
Calcium: 10 mg/dL (ref 8.4–10.5)
Chloride: 102 mEq/L (ref 96–112)
Creatinine, Ser: 0.7 mg/dL (ref 0.40–1.20)
GFR: 90.81 mL/min (ref >60.00)
Glucose, Bld: 82 mg/dL (ref 70–99)
Potassium: 4.7 mEq/L (ref 3.5–5.1)
Sodium: 138 mEq/L (ref 135–145)

## 2022-12-05 LAB — TSH: TSH: 0.86 u[IU]/mL (ref 0.35–5.50)

## 2022-12-05 LAB — HEPATIC FUNCTION PANEL
ALT: 15 U/L (ref 0–35)
AST: 20 U/L (ref 0–37)
Albumin: 4.4 g/dL (ref 3.5–5.2)
Alkaline Phosphatase: 67 U/L (ref 39–117)
Bilirubin, Direct: 0.1 mg/dL (ref 0.0–0.3)
Total Bilirubin: 0.5 mg/dL (ref 0.2–1.2)
Total Protein: 7 g/dL (ref 6.0–8.3)

## 2022-12-05 LAB — HEMOGLOBIN A1C: Hgb A1c MFr Bld: 5.6 % (ref 4.6–6.5)

## 2022-12-05 LAB — VITAMIN B12: Vitamin B-12: 872 pg/mL (ref 211–911)

## 2022-12-05 LAB — VITAMIN D 25 HYDROXY (VIT D DEFICIENCY, FRACTURES): VITD: 52.99 ng/mL (ref 30.00–100.00)

## 2022-12-05 NOTE — Progress Notes (Signed)
Subjective:    Patient ID: Tracey Sweeney, female    DOB: Sep 16, 1955, 67 y.o.   MRN: [id number]  HPI Here to follow up on issues. Her main concern today is generalized fatigue and a little daytime sleepiness. She thinks she is a mouth breather at night because her mouth is so dry when she wakes up. She does not know if she snores or not. She usually sleeps well. No recent change in diet or activity. Her weight is stable. She sees Dr. Huel Cote for orthopedic care. She sees Dr. Eileen Stanford for her allergies. She sees Dr. Hadley Pen for her hx of renal stones. She does admit to being under a lot of stress recently.    Review of Systems  Constitutional:  Positive for fatigue.  HENT: Negative.    Eyes: Negative.   Respiratory: Negative.    Cardiovascular: Negative.   Gastrointestinal: Negative.   Genitourinary:  Negative for decreased urine volume, difficulty urinating, dyspareunia, dysuria, enuresis, flank pain, frequency, hematuria, pelvic pain and urgency.  Musculoskeletal: Negative.   Skin: Negative.   Neurological: Negative.  Negative for headaches.  Psychiatric/Behavioral: Negative.         Objective:   Physical Exam Constitutional:      General: She is not in acute distress.    Appearance: Normal appearance. She is well-developed.  HENT:     Head: Normocephalic and atraumatic.     Right Ear: External ear normal.     Left Ear: External ear normal.     Nose: Nose normal.     Mouth/Throat:     Pharynx: No oropharyngeal exudate.  Eyes:     General: No scleral icterus.    Conjunctiva/sclera: Conjunctivae normal.     Pupils: Pupils are equal, round, and reactive to light.  Neck:     Thyroid: No thyromegaly.     Vascular: No JVD.  Cardiovascular:     Rate and Rhythm: Normal rate and regular rhythm.     Pulses: Normal pulses.     Heart sounds: Normal heart sounds. No murmur heard.    No friction rub. No gallop.  Pulmonary:     Effort: Pulmonary effort is normal. No  respiratory distress.     Breath sounds: Normal breath sounds. No wheezing or rales.  Chest:     Chest wall: No tenderness.  Abdominal:     General: Bowel sounds are normal. There is no distension.     Palpations: Abdomen is soft. There is no mass.     Tenderness: There is no abdominal tenderness. There is no guarding or rebound.  Musculoskeletal:        General: No tenderness. Normal range of motion.     Cervical back: Normal range of motion and neck supple.  Lymphadenopathy:     Cervical: No cervical adenopathy.  Skin:    General: Skin is warm and dry.     Findings: No erythema or rash.  Neurological:     General: No focal deficit present.     Mental Status: She is alert and oriented to person, place, and time.     Cranial Nerves: No cranial nerve deficit.     Motor: No abnormal muscle tone.     Coordination: Coordination normal.     Deep Tendon Reflexes: Reflexes are normal and symmetric. Reflexes normal.  Psychiatric:        Mood and Affect: Mood normal.        Behavior: Behavior normal.  Thought Content: Thought content normal.        Judgment: Judgment normal.           Assessment & Plan:  She has generalized fatigue and some daytime somnolence. We will refer her to Pulmonology to evaluate for possible sleep apnea. Get labs today to screen for diabetes, anemia, thyroid disorders, etc. We spent a total of ( 32  ) minutes reviewing records and discussing these issues.  Gershon Crane, MD

## 2023-01-24 ENCOUNTER — Encounter: Payer: Self-pay | Admitting: Adult Health

## 2023-01-24 ENCOUNTER — Ambulatory Visit: Payer: Medicare HMO | Admitting: Adult Health

## 2023-01-24 VITALS — BP 122/62 | HR 85 | Temp 98.1°F | Ht 65.0 in | Wt 138.0 lb

## 2023-01-24 DIAGNOSIS — R0683 Snoring: Secondary | ICD-10-CM | POA: Diagnosis not present

## 2023-01-24 DIAGNOSIS — G4700 Insomnia, unspecified: Secondary | ICD-10-CM | POA: Diagnosis not present

## 2023-01-24 NOTE — Progress Notes (Unsigned)
@Patient  ID: Tracey Sweeney, female    DOB: [birth year], 67 y.o.   MRN: [id number]  No chief complaint on file.   Referring provider: Nelwyn Salisbury, MD  HPI:   TEST/EVENTS :   Allergies  Allergen Reactions   Etodolac     Made stomach bleed   Diclofenac Sodium Other (See Comments)    Pt unsure of reaction   Penicillins     REACTION: Hives   Sulfonamide Derivatives     REACTION: Nausea/vomiting   Tetracyclines & Related     Syncope     Immunization History  Administered Date(s) Administered   Influenza Split 01/19/2012, 01/29/2013   Influenza,inj,Quad PF,6+ Mos 02/18/2014, 03/08/2015, 03/08/2016, 01/22/2017, 02/14/2018, 12/31/2018   Influenza-Unspecified 01/27/2021   PFIZER(Purple Top)SARS-COV-2 Vaccination 08/07/2019, 09/04/2019, 04/15/2020, 09/03/2020   Pfizer Covid-19 Vaccine Bivalent Booster 67yrs & up 01/17/2021   Tdap 02/18/2014, 09/19/2020   Zoster Recombinant(Shingrix) 06/27/2018, 11/14/2018    Past Medical History:  Diagnosis Date   Allergy    Arthritis    has an autoimmune disease   Cataract    Bil surgery   GERD (gastroesophageal reflux disease)    Insomnia    Kidney stones dec 2014   sees Dr. Georgette Shell with [facility]    Varicose veins of legs     Tobacco History: Social History   Tobacco Use  Smoking Status Never  Smokeless Tobacco Never   Counseling given: Not Answered   Outpatient Medications Prior to Visit  Medication Sig Dispense Refill   CITRUS BERGAMOT PO Take by mouth daily at 2 PM.     cyanocobalamin (VITAMIN B12) 1000 MCG tablet Take 1,000 mcg by mouth daily.     desloratadine (CLARINEX) 5 MG tablet Take 1 tablet (5 mg total) by mouth daily. 90 tablet 3   Guaifenesin 1200 MG TB12 Take 1 tablet by mouth 2 (two) times daily.      Omega-3 Fatty Acids (FISH OIL) 1200 MG CAPS Take by mouth daily.     zolpidem (AMBIEN) 10 MG tablet Take 1 tablet (10 mg total) by mouth at bedtime as needed for sleep. 30 tablet 5    Facility-Administered Medications Prior to Visit  Medication Dose Route Frequency Provider Last Rate Last Admin   triamcinolone acetonide (KENALOG) 10 MG/ML injection 10 mg  10 mg Other Once Vivi Barrack, DPM         Review of Systems:   Constitutional:   No  weight loss, night sweats,  Fevers, chills, fatigue, or  lassitude.  HEENT:   No headaches,  Difficulty swallowing,  Tooth/dental problems, or  Sore throat,                No sneezing, itching, ear ache, nasal congestion, post nasal drip,   CV:  No chest pain,  Orthopnea, PND, swelling in lower extremities, anasarca, dizziness, palpitations, syncope.   GI  No heartburn, indigestion, abdominal pain, nausea, vomiting, diarrhea, change in bowel habits, loss of appetite, bloody stools.   Resp: No shortness of breath with exertion or at rest.  No excess mucus, no productive cough,  No non-productive cough,  No coughing up of blood.  No change in color of mucus.  No wheezing.  No chest wall deformity  Skin: no rash or lesions.  GU: no dysuria, change in color of urine, no urgency or frequency.  No flank pain, no hematuria   MS:  No joint pain or swelling.  No decreased range of motion.  No  back pain.    Physical Exam  There were no vitals taken for this visit.  GEN: A/Ox3; pleasant , NAD, well nourished    HEENT:  [place]/AT,  EACs-clear, TMs-wnl, NOSE-clear, THROAT-clear, no lesions, no postnasal drip or exudate noted.   NECK:  Supple w/ fair ROM; no JVD; normal carotid impulses w/o bruits; no thyromegaly or nodules palpated; no lymphadenopathy.    RESP  Clear  P & A; w/o, wheezes/ rales/ or rhonchi. no accessory muscle use, no dullness to percussion  CARD:  RRR, no m/r/g, no peripheral edema, pulses intact, no cyanosis or clubbing.  GI:   Soft & nt; nml bowel sounds; no organomegaly or masses detected.   Musco: Warm bil, no deformities or joint swelling noted.   Neuro: alert, no focal deficits noted.    Skin:  Warm, no lesions or rashes    Lab Results:  CBC    Component Value Date/Time   WBC 7.2 12/05/2022 1055   RBC 4.66 12/05/2022 1055   HGB 14.0 12/05/2022 1055   HCT 42.2 12/05/2022 1055   PLT 227.0 12/05/2022 1055   MCV 90.4 12/05/2022 1055   MCHC 33.3 12/05/2022 1055   RDW 13.7 12/05/2022 1055   LYMPHSABS 2.4 12/05/2022 1055   MONOABS 0.6 12/05/2022 1055   EOSABS 0.1 12/05/2022 1055   BASOSABS 0.1 12/05/2022 1055    BMET    Component Value Date/Time   NA 138 12/05/2022 1055   K 4.7 12/05/2022 1055   CL 102 12/05/2022 1055   CO2 28 12/05/2022 1055   GLUCOSE 82 12/05/2022 1055   BUN 13 12/05/2022 1055   CREATININE 0.70 12/05/2022 1055   CALCIUM 10.0 12/05/2022 1055   GFRNONAA 81 08/20/2007 1042   GFRAA 98 08/20/2007 1042    BNP No results found for: "BNP"  ProBNP No results found for: "PROBNP"  Imaging: No results found.  Administration History     None           No data to display          No results found for: "NITRICOXIDE"      Assessment & Plan:   No problem-specific Assessment & Plan notes found for this encounter.     Rubye Oaks, NP 01/24/2023

## 2023-01-24 NOTE — Patient Instructions (Signed)
Home sleep study  Healthy sleep regimen  Exercise , yoga and stress reducers as discussed  Decrease Ambien 5mg  At bedtime  As needed  insomnia  Grief Counseling as discussed  Follow  up in 6 weeks to discuss results and treatment plan

## 2023-01-25 DIAGNOSIS — G4700 Insomnia, unspecified: Secondary | ICD-10-CM | POA: Insufficient documentation

## 2023-01-25 DIAGNOSIS — R0683 Snoring: Secondary | ICD-10-CM | POA: Insufficient documentation

## 2023-01-25 NOTE — Assessment & Plan Note (Signed)
Chronic insomnia - healthy sleep regimen  We discussed activities. Advised on counseling to help with stress/grief .  Would decrease Ambien to 5mg  At bedtime  As needed     Plan Patient Instructions  Home sleep study  Healthy sleep regimen  Exercise , yoga and stress reducers as discussed  Decrease Ambien 5mg  At bedtime  As needed  insomnia  Grief Counseling as discussed  Follow  up in 6 weeks to discuss results and treatment plan

## 2023-01-25 NOTE — Assessment & Plan Note (Signed)
Snoring, restless sleep, daytime sleepiness all concerning for possible underlying sleep apnea.  Will set up for home sleep study. - discussed how weight can impact sleep and risk for sleep disordered breathing - discussed options to assist with weight loss: combination of diet modification, cardiovascular and strength training exercises   - had an extensive discussion regarding the adverse health consequences related to untreated sleep disordered breathing - specifically discussed the risks for hypertension, coronary artery disease, cardiac dysrhythmias, cerebrovascular disease, and diabetes - lifestyle modification discussed   - discussed how sleep disruption can increase risk of accidents, particularly when driving - safe driving practices were discussed   Plan  Patient Instructions  Home sleep study  Healthy sleep regimen  Exercise , yoga and stress reducers as discussed  Decrease Ambien 5mg  At bedtime  As needed  insomnia  Grief Counseling as discussed  Follow  up in 6 weeks to discuss results and treatment plan

## 2023-02-01 DIAGNOSIS — Z1231 Encounter for screening mammogram for malignant neoplasm of breast: Secondary | ICD-10-CM | POA: Diagnosis not present

## 2023-02-01 LAB — HM MAMMOGRAPHY

## 2023-02-02 DIAGNOSIS — H52223 Regular astigmatism, bilateral: Secondary | ICD-10-CM | POA: Diagnosis not present

## 2023-02-11 DIAGNOSIS — R0683 Snoring: Secondary | ICD-10-CM

## 2023-02-11 DIAGNOSIS — G4730 Sleep apnea, unspecified: Secondary | ICD-10-CM | POA: Diagnosis not present

## 2023-03-13 ENCOUNTER — Ambulatory Visit: Payer: Medicare HMO | Admitting: Adult Health

## 2023-05-01 ENCOUNTER — Encounter: Payer: Medicare HMO | Admitting: Family Medicine

## 2023-05-15 ENCOUNTER — Encounter: Payer: Self-pay | Admitting: Adult Health

## 2023-05-15 ENCOUNTER — Ambulatory Visit: Payer: Medicare HMO | Admitting: Adult Health

## 2023-05-15 VITALS — BP 106/48 | HR 83 | Temp 97.7°F | Ht 66.0 in | Wt 141.8 lb

## 2023-05-15 DIAGNOSIS — G4733 Obstructive sleep apnea (adult) (pediatric): Secondary | ICD-10-CM | POA: Diagnosis not present

## 2023-05-15 DIAGNOSIS — G4700 Insomnia, unspecified: Secondary | ICD-10-CM | POA: Diagnosis not present

## 2023-05-15 NOTE — Progress Notes (Signed)
 @Patient  ID: Tracey Sweeney, female    DOB: 02-Jun-1955, 68 y.o.   MRN: [id number]  Chief Complaint  Patient presents with   Follow-up    Referring provider: Johnny Garnette LABOR, MD  HPI: 68 year old female seen for sleep consult to January 24, 2023 for snoring, daytime sleepiness and chronic insomnia  TEST/EVENTS :  Home sleep study February 11, 2023 AHI 7.6/hour and SpO2 low at 90%  05/15/2023 Follow up : OSA  Patient presents for a follow-up visit.  Patient was seen in September for sleep consult for snoring, daytime sleepiness and chronic insomnia.  She was set up for a home sleep study that was completed February 11, 2023 that showed mild sleep apnea with AHI 7.6/hour and SpO2 low at 90%.  We discussed her sleep study results in detail.  Went over treatment options including positional sleep oral appliance and CPAP therapy.  Patient has significant dental work .  Does not feel that she would be a candidate for dental appliance.  We discussed CPAP therapy.  Patient for now wants to hold off on CPAP.  She does feel that she is sleeping some better.  She had previously tried trazodone , Lunesta  and Ambien .  Failed Ambien  work the best.  But she has stopped taking Ambien  and is working more on a healthier sleep and stress reducers during the daytime.  She has had a very stressful year as her sister passed away.  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   Allergies  Allergen Reactions   Etodolac     Made stomach bleed   Diclofenac  Sodium Other (See Comments)    Pt unsure of reaction   Penicillins     REACTION: Hives   Sulfonamide Derivatives     REACTION: Nausea/vomiting   Tetracyclines & Related     Syncope     Immunization History  Administered Date(s) Administered   Influenza Split 01/19/2012, 01/29/2013   Influenza,inj,Quad PF,6+ Mos 02/18/2014, 03/08/2015, 03/08/2016, 01/22/2017, 02/14/2018, 12/31/2018   Influenza-Unspecified 01/27/2021   PFIZER(Purple  Top)SARS-COV-2 Vaccination 08/07/2019, 09/04/2019, 04/15/2020, 09/03/2020   Pfizer Covid-19 Vaccine Bivalent Booster 71yrs & up 01/17/2021   Tdap 02/18/2014, 09/19/2020   Zoster Recombinant(Shingrix) 06/27/2018, 11/14/2018    Past Medical History:  Diagnosis Date   Allergy    Arthritis    has an autoimmune disease   Cataract    Bil surgery   GERD (gastroesophageal reflux disease)    Insomnia    Kidney stones dec 2014   sees Dr. Prentice Sizer with [facility]    Varicose veins of legs     Tobacco History: Social History   Tobacco Use  Smoking Status Never  Smokeless Tobacco Never   Counseling given: Not Answered   Outpatient Medications Prior to Visit  Medication Sig Dispense Refill   CITRUS BERGAMOT PO Take by mouth daily at 2 PM.     cyanocobalamin  (VITAMIN B12) 1000 MCG tablet Take 1,000 mcg by mouth daily.     desloratadine  (CLARINEX ) 5 MG tablet Take 1 tablet (5 mg total) by mouth daily. 90 tablet 3   Guaifenesin 1200 MG TB12 Take 2 tablets by mouth daily.     Omega-3 Fatty Acids (FISH OIL) 1200 MG CAPS Take by mouth daily.     zolpidem  (AMBIEN ) 10 MG tablet Take 1 tablet (10 mg total) by mouth at bedtime as needed for sleep. (Patient not taking: Reported on 05/15/2023) 30 tablet 5   Facility-Administered Medications Prior to Visit  Medication Dose Route Frequency Provider Last Rate Last Admin   triamcinolone  acetonide (KENALOG ) 10 MG/ML injection 10 mg  10 mg Other Once Wagoner, Matthew R, DPM         Review of Systems:   Constitutional:   No  weight loss, night sweats,  Fevers, chills, fatigue, or  lassitude.  HEENT:   No headaches,  Difficulty swallowing,  Tooth/dental problems, or  Sore throat,                No sneezing, itching, ear ache, nasal congestion, post nasal drip,   CV:  No chest pain,  Orthopnea, PND, swelling in lower extremities, anasarca, dizziness, palpitations, syncope.   GI  No heartburn, indigestion, abdominal pain, nausea, vomiting,  diarrhea, change in bowel habits, loss of appetite, bloody stools.   Resp: No shortness of breath with exertion or at rest.  No excess mucus, no productive cough,  No non-productive cough,  No coughing up of blood.  No change in color of mucus.  No wheezing.  No chest wall deformity  Skin: no rash or lesions.  GU: no dysuria, change in color of urine, no urgency or frequency.  No flank pain, no hematuria   MS:  No joint pain or swelling.  No decreased range of motion.  No back pain.    Physical Exam  BP (!) 106/48 (BP Location: Left Arm, Patient Position: Sitting, Cuff Size: Normal)   Pulse 83   Temp 97.7 F (36.5 C) (Temporal)   Ht 5' 6 (1.676 m)   Wt 141 lb 12.8 oz (64.3 kg)   SpO2 96%   BMI 22.89 kg/m   GEN: A/Ox3; pleasant , NAD, well nourished    HEENT:  [place]/AT,  NOSE-clear, THROAT-clear, no lesions, no postnasal drip or exudate noted.  Class 2 MP airway   NECK:  Supple w/ fair ROM; no JVD; normal carotid impulses w/o bruits; no thyromegaly or nodules palpated; no lymphadenopathy.    RESP  Clear  P & A; w/o, wheezes/ rales/ or rhonchi. no accessory muscle use, no dullness to percussion  CARD:  RRR, no m/r/g, no peripheral edema, pulses intact, no cyanosis or clubbing.  GI:   Soft & nt; nml bowel sounds; no organomegaly or masses detected.   Musco: Warm bil, no deformities or joint swelling noted.   Neuro: alert, no focal deficits noted.    Skin: Warm, no lesions or rashes    Lab Results:  CBC  BNP No results found for: BNP  ProBNP No results found for: PROBNP  Imaging: No results found.  Administration History     None           No data to display          No results found for: NITRICOXIDE      Assessment & Plan:   OSA (obstructive sleep apnea) Mild obstructive sleep apnea.  Patient education given on sleep apnea.  We went over her sleep study results in detail.  We reviewed treatment options including positional sleep, oral  appliance and CPAP therapy.  Patient is going to work on positional sleep.  Does not feel that she is a oral appliance candidate due to significant dental work.  She wants to hold off on CPAP at this time. Advised her to follow back up in 1 year and as needed.  Advised to call back if she continues to be symptomatic and can consider CPAP therapy  Insomnia Chronic insomnia seems to be improving.  She is not currently using any type of sleep aids.  Continue with healthy sleep regimen.  Plan  Patient Instructions  Healthy sleep regimen  Exercise , yoga and stress reducers as discussed  Sleep with head of bed elevated at 30 degrees  Avoid sleeping on your back  Follow up in 1 year and As needed        Madelin Stank, NP 05/15/2023

## 2023-05-15 NOTE — Assessment & Plan Note (Signed)
 Chronic insomnia seems to be improving.  She is not currently using any type of sleep aids.  Continue with healthy sleep regimen.  Plan  Patient Instructions  Healthy sleep regimen  Exercise , yoga and stress reducers as discussed  Sleep with head of bed elevated at 30 degrees  Avoid sleeping on your back  Follow up in 1 year and As needed

## 2023-05-15 NOTE — Patient Instructions (Addendum)
 Healthy sleep regimen  Exercise , yoga and stress reducers as discussed  Sleep with head of bed elevated at 30 degrees  Avoid sleeping on your back  Follow up in 1 year and As needed

## 2023-05-15 NOTE — Assessment & Plan Note (Signed)
 Mild obstructive sleep apnea.  Patient education given on sleep apnea.  We went over her sleep study results in detail.  We reviewed treatment options including positional sleep, oral appliance and CPAP therapy.  Patient is going to work on positional sleep.  Does not feel that she is a oral appliance candidate due to significant dental work.  She wants to hold off on CPAP at this time. Advised her to follow back up in 1 year and as needed.  Advised to call back if she continues to be symptomatic and can consider CPAP therapy

## 2023-05-18 ENCOUNTER — Ambulatory Visit (INDEPENDENT_AMBULATORY_CARE_PROVIDER_SITE_OTHER): Payer: Medicare HMO | Admitting: Family Medicine

## 2023-05-18 ENCOUNTER — Encounter: Payer: Self-pay | Admitting: Family Medicine

## 2023-05-18 VITALS — BP 102/60 | HR 73 | Temp 98.2°F | Ht 66.0 in | Wt 137.2 lb

## 2023-05-18 DIAGNOSIS — R739 Hyperglycemia, unspecified: Secondary | ICD-10-CM

## 2023-05-18 DIAGNOSIS — Z23 Encounter for immunization: Secondary | ICD-10-CM

## 2023-05-18 DIAGNOSIS — G4700 Insomnia, unspecified: Secondary | ICD-10-CM | POA: Diagnosis not present

## 2023-05-18 DIAGNOSIS — E785 Hyperlipidemia, unspecified: Secondary | ICD-10-CM | POA: Diagnosis not present

## 2023-05-18 DIAGNOSIS — K219 Gastro-esophageal reflux disease without esophagitis: Secondary | ICD-10-CM

## 2023-05-18 DIAGNOSIS — G4733 Obstructive sleep apnea (adult) (pediatric): Secondary | ICD-10-CM

## 2023-05-18 DIAGNOSIS — J3089 Other allergic rhinitis: Secondary | ICD-10-CM | POA: Diagnosis not present

## 2023-05-18 LAB — CBC WITH DIFFERENTIAL/PLATELET
Basophils Absolute: 0 10*3/uL (ref 0.0–0.1)
Basophils Relative: 0.7 % (ref 0.0–3.0)
Eosinophils Absolute: 0 10*3/uL (ref 0.0–0.7)
Eosinophils Relative: 0.8 % (ref 0.0–5.0)
HCT: 41.9 % (ref 36.0–46.0)
Hemoglobin: 14.2 g/dL (ref 12.0–15.0)
Lymphocytes Relative: 38.5 % (ref 12.0–46.0)
Lymphs Abs: 2.2 10*3/uL (ref 0.7–4.0)
MCHC: 33.8 g/dL (ref 30.0–36.0)
MCV: 90 fL (ref 78.0–100.0)
Monocytes Absolute: 0.4 10*3/uL (ref 0.1–1.0)
Monocytes Relative: 7.2 % (ref 3.0–12.0)
Neutro Abs: 3 10*3/uL (ref 1.4–7.7)
Neutrophils Relative %: 52.8 % (ref 43.0–77.0)
Platelets: 223 10*3/uL (ref 150.0–400.0)
RBC: 4.65 Mil/uL (ref 3.87–5.11)
RDW: 13.2 % (ref 11.5–15.5)
WBC: 5.7 10*3/uL (ref 4.0–10.5)

## 2023-05-18 LAB — BASIC METABOLIC PANEL
BUN: 16 mg/dL (ref 6–23)
CO2: 26 meq/L (ref 19–32)
Calcium: 9.5 mg/dL (ref 8.4–10.5)
Chloride: 107 meq/L (ref 96–112)
Creatinine, Ser: 0.66 mg/dL (ref 0.40–1.20)
GFR: 91.81 mL/min (ref >60.00)
Glucose, Bld: 94 mg/dL (ref 70–99)
Potassium: 4.1 meq/L (ref 3.5–5.1)
Sodium: 142 meq/L (ref 135–145)

## 2023-05-18 LAB — LIPID PANEL
Cholesterol: 251 mg/dL — ABNORMAL HIGH (ref 0–200)
HDL: 78.8 mg/dL (ref >39.00)
LDL Cholesterol: 158 mg/dL — ABNORMAL HIGH (ref 0–99)
NonHDL: 171.91
Total CHOL/HDL Ratio: 3
Triglycerides: 68 mg/dL (ref 0.0–149.0)
VLDL: 13.6 mg/dL (ref 0.0–40.0)

## 2023-05-18 LAB — HEMOGLOBIN A1C: Hgb A1c MFr Bld: 5.7 % (ref 4.6–6.5)

## 2023-05-18 LAB — HEPATIC FUNCTION PANEL
ALT: 12 U/L (ref 0–35)
AST: 17 U/L (ref 0–37)
Albumin: 4.6 g/dL (ref 3.5–5.2)
Alkaline Phosphatase: 72 U/L (ref 39–117)
Bilirubin, Direct: 0.1 mg/dL (ref 0.0–0.3)
Total Bilirubin: 0.4 mg/dL (ref 0.2–1.2)
Total Protein: 7.1 g/dL (ref 6.0–8.3)

## 2023-05-18 LAB — TSH: TSH: 0.89 u[IU]/mL (ref 0.35–5.50)

## 2023-05-18 NOTE — Progress Notes (Signed)
Subjective:    Patient ID: Tracey Sweeney, female    DOB: [birth year], 68 y.o.   MRN: [id number]  HPI Here to follow up on issues. She has no complaints today. She has stopped taking Ambien, and she is actually sleeping better than she used to. She sees Pulmonology regularly for her sleep apnea, and so far she has not required any specific treatments. Her GERD is controlled. 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 Her allergies have been stable.    Review of Systems  Constitutional: Negative.   HENT: Negative.    Eyes: Negative.   Respiratory: Negative.    Cardiovascular: Negative.   Gastrointestinal: Negative.   Genitourinary:  Negative for decreased urine volume, difficulty urinating, dyspareunia, dysuria, enuresis, flank pain, frequency, hematuria, pelvic pain and urgency.  Musculoskeletal: Negative.   Skin: Negative.   Neurological: Negative.  Negative for headaches.  Psychiatric/Behavioral: Negative.         Objective:   Physical Exam Constitutional:      General: She is not in acute distress.    Appearance: Normal appearance. She is well-developed.  HENT:     Head: Normocephalic and atraumatic.     Right Ear: External ear normal.     Left Ear: External ear normal.     Nose: Nose normal.     Mouth/Throat:     Pharynx: No oropharyngeal exudate.  Eyes:     General: No scleral icterus.    Conjunctiva/sclera: Conjunctivae normal.     Pupils: Pupils are equal, round, and reactive to light.  Neck:     Thyroid: No thyromegaly.     Vascular: No JVD.  Cardiovascular:     Rate and Rhythm: Normal rate and regular rhythm.     Pulses: Normal pulses.     Heart sounds: Normal heart sounds. No murmur heard.    No friction rub. No gallop.  Pulmonary:     Effort: Pulmonary effort is normal. No respiratory distress.     Breath sounds: Normal breath sounds. No wheezing or rales.  Chest:     Chest  wall: No tenderness.  Abdominal:     General: Bowel sounds are normal. There is no distension.     Palpations: Abdomen is soft. There is no mass.     Tenderness: There is no abdominal tenderness. There is no guarding or rebound.  Musculoskeletal:        General: No tenderness. Normal range of motion.     Cervical back: Normal range of motion and neck supple.  Lymphadenopathy:     Cervical: No cervical adenopathy.  Skin:    General: Skin is warm and dry.     Findings: No erythema or rash.  Neurological:     General: No focal deficit present.     Mental Status: She is alert and oriented to person, place, and time.     Cranial Nerves: No cranial nerve deficit.     Motor: No abnormal muscle tone.     Coordination: Coordination normal.     Deep Tendon Reflexes: Reflexes are normal and symmetric. Reflexes normal.  Psychiatric:        Mood and Affect: Mood normal.        Behavior: Behavior normal.        Thought Content: Thought content normal.        Judgment: Judgment normal.  Assessment & Plan:  Her GERD  and allergies are well controlled. Her insomnia and OSA are stable. We will get fasting labs to check lipids, etc. We spent a total of ( 31  ) minutes reviewing records and discussing these issues.  Gershon Crane, MD

## 2023-05-18 NOTE — Addendum Note (Signed)
Addended by: Carola Rhine on: 05/18/2023 03:02 PM   Modules accepted: Orders

## 2023-05-21 ENCOUNTER — Encounter: Payer: Self-pay | Admitting: *Deleted

## 2023-06-08 ENCOUNTER — Ambulatory Visit: Payer: Self-pay | Admitting: Podiatry

## 2023-07-17 DIAGNOSIS — N200 Calculus of kidney: Secondary | ICD-10-CM | POA: Diagnosis not present

## 2023-07-26 ENCOUNTER — Ambulatory Visit: Admission: EM | Admit: 2023-07-26 | Discharge: 2023-07-26 | Disposition: A | Discharge status: Home / Self Care

## 2023-07-26 DIAGNOSIS — T162XXA Foreign body in left ear, initial encounter: Secondary | ICD-10-CM | POA: Diagnosis not present

## 2023-07-26 NOTE — ED Triage Notes (Signed)
 Pt c/o piece of silicone ear piece stuck in LT ear since earlier today.

## 2023-07-26 NOTE — Discharge Instructions (Addendum)
 Silicone tip removed successfully from left ear canal.  Advised patient if symptoms worsen and/or unresolved please follow-up with your PCP, ENT or here for further evaluation.

## 2023-07-26 NOTE — ED Provider Notes (Signed)
 [name]    CSN: [id number] Arrival date & time: 07/26/23  1240      History   Chief Complaint Chief Complaint  Patient presents with   Foreign Body in Ear    LT    HPI Tracey Sweeney is a 68 y.o. female.   HPI Very pleasant 68 year old female presents with foreign body in left ear canal.  Patient reports audiologist informed her that silicone covering of hearing aid is in her left ear canal.  PMH significant for bilateral cataract, kidney stones, and insomnia.  Past Medical History:  Diagnosis Date   Allergy    Arthritis    has an autoimmune disease   Cataract    Bil surgery   GERD (gastroesophageal reflux disease)    Insomnia    Kidney stones 03/2013   sees Dr. Georgette Shell with [facility]    Personal history presenting hazards to health 01/28/2007   Qualifier: Diagnosis of   By: Carma Lair      IMO SNOMED Dx Update Oct 2024     Varicose veins of legs     Patient Active Problem List   Diagnosis Date Noted   Environmental and seasonal allergies 05/18/2023   OSA (obstructive sleep apnea) 05/15/2023   Snoring 01/25/2023   Insomnia 01/25/2023   Dyslipidemia 12/05/2022   Hematuria 07/25/2012   BACK PAIN, LUMBAR 09/30/2009   VERTIGO, POSITIONAL 10/18/2007   GERD 08/27/2007   Allergic rhinitis 01/28/2007   Disorder of skin or subcutaneous tissue 01/28/2007   HX, PERSONAL, CERVICAL DYSPLASIA 01/28/2007    Past Surgical History:  Procedure Laterality Date   CATARACT EXTRACTION     Bil   cervix laser surgery     COLONOSCOPY  12/16/2018   per Dr. Wendall Papa, adenomatous polyps, repeat in 5 yrs    KIDNEY STONE SURGERY     had 2 stents put in   LITHOTRIPSY  2014   POLYPECTOMY     TONSILLECTOMY     varicose veins     Dr. Donia Ast    OB History   No obstetric history on file.      Home Medications    Prior to Admission medications   Medication Sig Start Date End Date Taking? Authorizing Provider  CITRUS BERGAMOT PO Take by mouth  daily at 2 PM.    [provider]  cyanocobalamin (VITAMIN B12) 1000 MCG tablet Take 1,000 mcg by mouth daily.    [provider]  desloratadine (CLARINEX) 5 MG tablet Take 1 tablet (5 mg total) by mouth daily. 09/21/22   Nelwyn Salisbury, MD  Guaifenesin 1200 MG TB12 Take 2 tablets by mouth daily.    [provider]  Omega-3 Fatty Acids (FISH OIL) 1200 MG CAPS Take by mouth daily.    [provider]    Family History Family History  Problem Relation Age of Onset   Cancer Other        skin, bladder, & breast   Arthritis Other    Diabetes Other    Hyperlipidemia Other    Hypertension Other    Kidney disease Other    Bladder Cancer Father    Melanoma Father    Diabetes Father    Melanoma Paternal Aunt    Heart disease Mother    Hypertension Mother    Colon cancer Sister    Emphysema Brother    Colon cancer Cousin     Social History Social History   Tobacco Use  Smoking status: Never   Smokeless tobacco: Never  Substance Use Topics   Alcohol use: No    Alcohol/week: 0.0 standard drinks of alcohol   Drug use: No     Allergies   Etodolac, Diclofenac sodium, Penicillins, Sulfonamide derivatives, and Tetracyclines & related   Review of Systems Review of Systems  HENT:         Possible foreign body of left EAC per patient/audiologist     Physical Exam Triage Vital Signs ED Triage Vitals  Encounter Vitals Group     BP      Systolic BP Percentile      Diastolic BP Percentile      Pulse      Resp      Temp      Temp src      SpO2      Weight      Height      Head Circumference      Peak Flow      Pain Score      Pain Loc      Pain Education      Exclude from Growth Chart    No data found.  Updated Vital Signs BP 132/79 (BP Location: Left Arm)   Pulse 86   Temp 98.1 F (36.7 C) (Oral)   Resp 17   SpO2 100%    Physical Exam Vitals reviewed.  Constitutional:      General: She is not in acute distress.     Appearance: Normal appearance. She is normal weight. She is not ill-appearing.  HENT:     Head: Normocephalic and atraumatic.     Right Ear: Tympanic membrane, ear canal and external ear normal.     Left Ear: External ear normal.     Ears:     Comments: Small gray silicone tip to hearing aid removed successfully from left EAC; left TM-clear, mildly retracted, mobile with good light reflex    Mouth/Throat:     Mouth: Mucous membranes are moist.     Pharynx: Oropharynx is clear.  Eyes:     Extraocular Movements: Extraocular movements intact.     Conjunctiva/sclera: Conjunctivae normal.     Pupils: Pupils are equal, round, and reactive to light.  Cardiovascular:     Rate and Rhythm: Normal rate and regular rhythm.     Pulses: Normal pulses.     Heart sounds: Normal heart sounds. No murmur heard. Pulmonary:     Effort: Pulmonary effort is normal.     Breath sounds: Normal breath sounds. No wheezing, rhonchi or rales.  Chest:     Chest wall: No tenderness.  Musculoskeletal:        General: Normal range of motion.     Cervical back: Normal range of motion and neck supple.  Skin:    General: Skin is warm and dry.  Neurological:     General: No focal deficit present.     Mental Status: She is alert and oriented to person, place, and time. Mental status is at baseline.  Psychiatric:        Mood and Affect: Mood normal.        Behavior: Behavior normal.      UC Treatments / Results  Labs (all labs ordered are listed, but only abnormal results are displayed) Labs Reviewed - No data to display  EKG   Radiology No results found.  Procedures Procedures (including critical care time)  Medications Ordered in UC Medications - No data  to display  Initial Impression / Assessment and Plan / UC Course  I have reviewed the triage vital signs and the nursing notes.  Pertinent labs & imaging results that were available during my care of the patient were reviewed by me and considered  in my medical decision making (see chart for details).     MDM: 1.  Foreign body of left ear, initial encounter-Silicone tip removed successfully from left ear canal.  Advised patient if symptoms worsen and/or unresolved please follow-up with your PCP, ENT or here for further evaluation.  Patient discharged home, hemodynamically stable. Final Clinical Impressions(s) / UC Diagnoses   Final diagnoses:  Foreign body of left ear, initial encounter     Discharge Instructions      Silicone tip removed successfully from left ear canal.  Advised patient if symptoms worsen and/or unresolved please follow-up with your PCP, ENT or here for further evaluation.     ED Prescriptions   None    PDMP not reviewed this encounter.   Trevor Iha, FNP 07/26/23 1346

## 2023-08-28 DIAGNOSIS — J309 Allergic rhinitis, unspecified: Secondary | ICD-10-CM | POA: Diagnosis not present

## 2023-08-28 DIAGNOSIS — H903 Sensorineural hearing loss, bilateral: Secondary | ICD-10-CM | POA: Diagnosis not present

## 2023-08-31 ENCOUNTER — Ambulatory Visit: Admitting: Family Medicine

## 2023-09-17 ENCOUNTER — Other Ambulatory Visit: Payer: Self-pay | Admitting: Family Medicine

## 2023-12-04 ENCOUNTER — Ambulatory Visit: Admitting: Family Medicine

## 2023-12-04 ENCOUNTER — Encounter: Payer: Self-pay | Admitting: Family Medicine

## 2023-12-04 VITALS — BP 124/72 | Temp 98.4°F | Wt 142.8 lb

## 2023-12-04 DIAGNOSIS — R1319 Other dysphagia: Secondary | ICD-10-CM | POA: Diagnosis not present

## 2023-12-04 NOTE — Progress Notes (Signed)
   Subjective:    Patient ID: Tracey Sweeney, female    DOB: 12/15/1955, 68 y.o.   MRN: [id number]  HPI Here for 6 months of trouble swallowing which she describes as feeling the food slowly work down her esophagus. She has mild discomfort but no real pain. She never chokes or vomits. This only happens with solids and not liquids. She rarely has heartburn. Of note she is due this month for a colonoscopy.    Review of Systems  Constitutional: Negative.   HENT:  Positive for trouble swallowing. Negative for sore throat.   Eyes: Negative.   Respiratory: Negative.    Cardiovascular: Negative.   Gastrointestinal: Negative.        Objective:   Physical Exam Constitutional:      Appearance: Normal appearance.  HENT:     Mouth/Throat:     Pharynx: Oropharynx is clear.  Eyes:     Conjunctiva/sclera: Conjunctivae normal.  Cardiovascular:     Rate and Rhythm: Normal rate and regular rhythm.     Pulses: Normal pulses.     Heart sounds: Normal heart sounds.  Pulmonary:     Effort: Pulmonary effort is normal.     Breath sounds: Normal breath sounds.  Lymphadenopathy:     Cervical: No cervical adenopathy.  Neurological:     Mental Status: She is alert.           Assessment & Plan:  Dysphagia. We will refer her to GI. I expect she will be scheduled for combined upper and lower endoscopies. Garnette Olmsted, MD

## 2023-12-12 ENCOUNTER — Encounter: Payer: Self-pay | Admitting: Gastroenterology

## 2024-01-10 DIAGNOSIS — H52223 Regular astigmatism, bilateral: Secondary | ICD-10-CM | POA: Diagnosis not present

## 2024-01-21 ENCOUNTER — Ambulatory Visit (INDEPENDENT_AMBULATORY_CARE_PROVIDER_SITE_OTHER)

## 2024-01-21 VITALS — BP 120/60 | HR 68 | Temp 98.5°F | Ht 66.0 in | Wt 143.6 lb

## 2024-01-21 DIAGNOSIS — Z0000 Encounter for general adult medical examination without abnormal findings: Secondary | ICD-10-CM

## 2024-01-21 NOTE — Patient Instructions (Addendum)
 [name],  Thank you for taking the time for your Medicare Wellness Visit. I appreciate your continued commitment to your health goals. Please review the care plan we discussed, and feel free to reach out if I can assist you further.  Medicare recommends these wellness visits once per year to help you and your care team stay ahead of potential health issues. These visits are designed to focus on prevention, allowing your provider to concentrate on managing your acute and chronic conditions during your regular appointments.  Please note that Annual Wellness Visits do not include a physical exam. Some assessments may be limited, especially if the visit was conducted virtually. If needed, we may recommend a separate in-person follow-up with your provider.  Ongoing Care Seeing your primary care provider every 3 to 6 months helps us  monitor your health and provide consistent, personalized care.   Referrals If a referral was made during today's visit and you haven't received any updates within two weeks, please contact the referred provider directly to check on the status.  Recommended Screenings:  Health Maintenance  Topic Date Due   Flu Shot  11/30/2023   Colon Cancer Screening  12/16/2023   COVID-19 Vaccine (6 - 2025-26 season) 12/31/2023   Medicare Annual Wellness Visit  01/20/2025   Breast Cancer Screening  01/31/2025   DTaP/Tdap/Td vaccine (3 - Td or Tdap) 09/20/2030   Pneumococcal Vaccine for age over 56  Completed   DEXA scan (bone density measurement)  Completed   Hepatitis C Screening  Completed   Zoster (Shingles) Vaccine  Completed   HPV Vaccine  Aged Out   Meningitis B Vaccine  Aged Out       01/21/2024    2:18 PM  Advanced Directives  Does Patient Have a Medical Advance Directive? No  Would patient like information on creating a medical advance directive? No - Patient declined   Advance Care Planning is important because it: Ensures you receive medical care that aligns  with your values, goals, and preferences. Provides guidance to your family and loved ones, reducing the emotional burden of decision-making during critical moments.  Vision: Annual vision screenings are recommended for early detection of glaucoma, cataracts, and diabetic retinopathy. These exams can also reveal signs of chronic conditions such as diabetes and high blood pressure.  Dental: Annual dental screenings help detect early signs of oral cancer, gum disease, and other conditions linked to overall health, including heart disease and diabetes.  Please see the attached documents for additional preventive care recommendations.

## 2024-01-21 NOTE — Progress Notes (Signed)
 Subjective:   Tracey Sweeney is a 68 y.o. who presents for a Medicare Wellness preventive visit.  As a reminder, Annual Wellness Visits don't include a physical exam, and some assessments may be limited, especially if this visit is performed virtually. We may recommend an in-person follow-up visit with your provider if needed.  Visit Complete: In person    Persons Participating in Visit: Patient.  AWV Questionnaire: Yes: Patient Medicare AWV questionnaire was completed by the patient on 01/17/24; I have confirmed that all information answered by patient is correct and no changes since this date.  Cardiac Risk Factors include: advanced age (>34men, >39 women);dyslipidemia     Objective:    Today's Vitals   01/21/24 1409  BP: 120/60  Pulse: 68  Temp: 98.5 F (36.9 C)  TempSrc: Oral  SpO2: 99%  Weight: 143 lb 9.6 oz (65.1 kg)  Height: 5' 6 (1.676 m)   Body mass index is 23.18 kg/m.     01/21/2024    2:18 PM 09/19/2020    1:43 PM 02/16/2017   11:08 AM 04/28/2013    7:21 AM  Advanced Directives  Does Patient Have a Medical Advance Directive? No No No  Patient does not have advance directive;Patient would not like information   Would patient like information on creating a medical advance directive? No - Patient declined No - Patient declined No - Patient declined    Pre-existing out of facility DNR order (yellow form or pink MOST form)    No      Data saved with a previous flowsheet row definition    Current Medications (verified) Outpatient Encounter Medications as of 01/21/2024  Medication Sig   CITRUS BERGAMOT PO Take by mouth daily at 2 PM.   cyanocobalamin  (VITAMIN B12) 1000 MCG tablet Take 1,000 mcg by mouth daily.   desloratadine  (CLARINEX ) 5 MG tablet TAKE 1 TABLET(5 MG) BY MOUTH DAILY   Omega-3 Fatty Acids (FISH OIL) 1200 MG CAPS Take by mouth daily.   Tamsulosin HCl (FLOMAX PO) Take by mouth daily. [facility]   Facility-Administered Encounter  Medications as of 01/21/2024  Medication   triamcinolone  acetonide (KENALOG ) 10 MG/ML injection 10 mg    Allergies (verified) Etodolac, Diclofenac  sodium, Penicillins, Sulfonamide derivatives, and Tetracyclines & related   History: Past Medical History:  Diagnosis Date   Allergy    Arthritis    has an autoimmune disease   Cataract    Bil surgery   GERD (gastroesophageal reflux disease)    Insomnia    Kidney stones 03/2013   sees Dr. Prentice Sizer with [facility]    Personal history presenting hazards to health 01/28/2007   Qualifier: Diagnosis of   By: Delos REYNOLDS Cower      IMO SNOMED Dx Update Oct 2024     Varicose veins of legs    Past Surgical History:  Procedure Laterality Date   CATARACT EXTRACTION     Bil   cervix laser surgery     COLONOSCOPY  12/16/2018   per Dr. Rolan Cedar, adenomatous polyps, repeat in 5 yrs    KIDNEY STONE SURGERY     had 2 stents put in   LITHOTRIPSY  2014   POLYPECTOMY     TONSILLECTOMY     varicose veins     Dr. Doreene   Family History  Problem Relation Age of Onset   Cancer Other        skin, bladder, & breast   Arthritis Other  Diabetes Other    Hyperlipidemia Other    Hypertension Other    Kidney disease Other    Bladder Cancer Father    Melanoma Father    Diabetes Father    Melanoma Paternal Aunt    Heart disease Mother    Hypertension Mother    Colon cancer Sister    Emphysema Brother    Colon cancer Cousin    Social History   Socioeconomic History   Marital status: Single    Spouse name: Not on file   Number of children: Not on file   Years of education: Not on file   Highest education level: Bachelor's degree (e.g., BA, AB, BS)  Occupational History   Not on file  Tobacco Use   Smoking status: Never   Smokeless tobacco: Never  Substance and Sexual Activity   Alcohol use: No    Alcohol/week: 0.0 standard drinks of alcohol   Drug use: No   Sexual activity: Not on file  Other Topics Concern   Not on  file  Social History Narrative   Not on file   Social Drivers of Health   Financial Resource Strain: Low Risk  (01/21/2024)   Overall Financial Resource Strain (CARDIA)    Difficulty of Paying Living Expenses: Not hard at all  Food Insecurity: No Food Insecurity (01/21/2024)   Hunger Vital Sign    Worried About Running Out of Food in the Last Year: Never true    Ran Out of Food in the Last Year: Never true  Transportation Needs: No Transportation Needs (01/21/2024)   PRAPARE - Administrator, Civil Service (Medical): No    Lack of Transportation (Non-Medical): No  Physical Activity: Sufficiently Active (01/21/2024)   Exercise Vital Sign    Days of Exercise per Week: 7 days    Minutes of Exercise per Session: 60 min  Stress: No Stress Concern Present (01/21/2024)   [employer] of Occupational Health - Occupational Stress Questionnaire    Feeling of Stress: Not at all  Social Connections: Socially Isolated (01/21/2024)   Social Connection and Isolation Panel    Frequency of Communication with Friends and Family: More than three times a week    Frequency of Social Gatherings with Friends and Family: More than three times a week    Attends Religious Services: Never    Database administrator or Organizations: No    Attends Engineer, structural: Never    Marital Status: Never married    Tobacco Counseling Counseling given: Not Answered    Clinical Intake:  Pre-visit preparation completed: Yes  Pain : No/denies pain     BMI - recorded: 23.18 Nutritional Status: BMI of 19-24  Normal Nutritional Risks: None Diabetes: No  Lab Results  Component Value Date   HGBA1C 5.7 05/18/2023   HGBA1C 5.6 12/05/2022   HGBA1C 5.8 12/23/2021     How often do you need to have someone help you when you read instructions, pamphlets, or other written materials from your doctor or pharmacy?: 1 - Never  Interpreter Needed?: No  Information entered by :: Rojelio Blush LPN   Activities of Daily Living     01/21/2024    2:16 PM 01/17/2024   10:56 AM  In your present state of health, do you have any difficulty performing the following activities:  Hearing? 1   Comment Wears Hearing Aids   Vision? 0 0  Difficulty concentrating or making decisions? 0 0  Walking or climbing  stairs? 0 0  Dressing or bathing? 0 0  Doing errands, shopping? 0 0  Preparing Food and eating ? N N  Using the Toilet? N N  In the past six months, have you accidently leaked urine? N N  Do you have problems with loss of bowel control? N N  Managing your Medications? N N  Managing your Finances? N N  Housekeeping or managing your Housekeeping? N N    Patient Care Team: Johnny Garnette LABOR, MD as PCP - General  I have updated your Care Teams any recent Medical Services you may have received from other providers in the past year.     Assessment:   This is a routine wellness examination for [facility].  Hearing/Vision screen Hearing Screening - Comments:: Wears Hearing Aids Vision Screening - Comments:: Wears rx glasses - up to date with routine eye exams with  Dr Thom Hamilton   Goals Addressed               This Visit's Progress     Continue physical activity (pt-stated)        Remain Active       Depression Screen     01/21/2024    2:15 PM 08/30/2022    9:07 AM 12/23/2021   10:22 AM 12/16/2021    2:39 PM 10/04/2021   11:20 AM 10/18/2020   11:21 AM 04/13/2020    8:33 AM  PHQ 2/9 Scores  PHQ - 2 Score 0 0 0  0 0 0  PHQ- 9 Score  1 0  0 0   Exception Documentation    Patient refusal       Fall Risk     01/21/2024    2:17 PM 01/17/2024   10:56 AM 05/15/2023    9:27 AM 12/05/2022   10:19 AM 08/30/2022    9:07 AM  Fall Risk   Falls in the past year? 0 0 0 0 0  Number falls in past yr: 0   0 0  Injury with Fall? 0   0 0  Risk for fall due to : No Fall Risks   No Fall Risks No Fall Risks  Follow up Falls evaluation completed   Falls evaluation completed Falls  evaluation completed    MEDICARE RISK AT HOME:  Medicare Risk at Home Any stairs in or around the home?: Yes If so, are there any without handrails?: No Home free of loose throw rugs in walkways, pet beds, electrical cords, etc?: Yes Adequate lighting in your home to reduce risk of falls?: Yes Life alert?: No Use of a cane, walker or w/c?: No Grab bars in the bathroom?: No Shower chair or bench in shower?: No  TIMED UP AND GO:  Was the test performed?  Yes  Length of time to ambulate 10 feet: 10 sec Gait steady and fast without use of assistive device  Cognitive Function: 6CIT completed        01/21/2024    2:18 PM  6CIT Screen  What Year? 0 points  What month? 0 points  What time? 0 points  Count back from 20 0 points  Months in reverse 0 points  Repeat phrase 0 points  Total Score 0 points    Immunizations Immunization History  Administered Date(s) Administered   Influenza Split 01/19/2012, 01/29/2013   Influenza,inj,Quad PF,6+ Mos 02/18/2014, 03/08/2015, 03/08/2016, 01/22/2017, 02/14/2018, 12/31/2018   Influenza-Unspecified 01/27/2021   PFIZER(Purple Top)SARS-COV-2 Vaccination 08/07/2019, 09/04/2019, 04/15/2020, 09/03/2020   PNEUMOCOCCAL CONJUGATE-20 05/18/2023  Research officer, trade union 7yrs & up 01/17/2021   Tdap 02/18/2014, 09/19/2020   Zoster Recombinant(Shingrix) 06/27/2018, 11/14/2018    Screening Tests Health Maintenance  Topic Date Due   Influenza Vaccine  11/30/2023   Colonoscopy  12/16/2023   COVID-19 Vaccine (6 - 2025-26 season) 12/31/2023   Medicare Annual Wellness (AWV)  01/20/2025   Mammogram  01/31/2025   DTaP/Tdap/Td (3 - Td or Tdap) 09/20/2030   Pneumococcal Vaccine: 50+ Years  Completed   DEXA SCAN  Completed   Hepatitis C Screening  Completed   Zoster Vaccines- Shingrix  Completed   HPV VACCINES  Aged Out   Meningococcal B Vaccine  Aged Out    Health Maintenance Items Addressed: Colonoscopy Deferred  Additional  Screening:  Vision Screening: Recommended annual ophthalmology exams for early detection of glaucoma and other disorders of the eye. Is the patient up to date with their annual eye exam?  Yes  Who is the provider or what is the name of the office in which the patient attends annual eye exams? Dr Thom Hamilton  Dental Screening: Recommended annual dental exams for proper oral hygiene  Community Resource Referral / Chronic Care Management: CRR required this visit?  No   CCM required this visit?  No   Plan:    I have personally reviewed and noted the following in the patient's chart:   Medical and social history Use of alcohol, tobacco or illicit drugs  Current medications and supplements including opioid prescriptions. Patient is not currently taking opioid prescriptions. Functional ability and status Nutritional status Physical activity Advanced directives List of other physicians Hospitalizations, surgeries, and ER visits in previous 12 months Vitals Screenings to include cognitive, depression, and falls Referrals and appointments  In addition, I have reviewed and discussed with patient certain preventive protocols, quality metrics, and best practice recommendations. A written personalized care plan for preventive services as well as general preventive health recommendations were provided to patient.   Rojelio LELON Blush, LPN   [id number]   After Visit Summary: (In Person-Declined) Patient declined AVS at this time.   Notes: Nothing significant to report at this time.

## 2024-02-04 DIAGNOSIS — Z1231 Encounter for screening mammogram for malignant neoplasm of breast: Secondary | ICD-10-CM | POA: Diagnosis not present

## 2024-02-04 LAB — HM MAMMOGRAPHY

## 2024-02-05 ENCOUNTER — Encounter: Payer: Self-pay | Admitting: Family Medicine

## 2024-02-07 DIAGNOSIS — H40053 Ocular hypertension, bilateral: Secondary | ICD-10-CM | POA: Diagnosis not present

## 2024-02-12 DIAGNOSIS — R928 Other abnormal and inconclusive findings on diagnostic imaging of breast: Secondary | ICD-10-CM | POA: Diagnosis not present

## 2024-02-12 DIAGNOSIS — N6001 Solitary cyst of right breast: Secondary | ICD-10-CM | POA: Diagnosis not present

## 2024-02-12 LAB — HM MAMMOGRAPHY

## 2024-02-13 ENCOUNTER — Encounter: Payer: Self-pay | Admitting: Family Medicine

## 2024-02-18 ENCOUNTER — Encounter: Payer: Self-pay | Admitting: Gastroenterology

## 2024-02-18 ENCOUNTER — Ambulatory Visit (INDEPENDENT_AMBULATORY_CARE_PROVIDER_SITE_OTHER): Admitting: Gastroenterology

## 2024-02-18 VITALS — BP 140/70 | HR 68 | Ht 66.0 in | Wt 143.0 lb

## 2024-02-18 DIAGNOSIS — Z800 Family history of malignant neoplasm of digestive organs: Secondary | ICD-10-CM | POA: Diagnosis not present

## 2024-02-18 DIAGNOSIS — Z860100 Personal history of colon polyps, unspecified: Secondary | ICD-10-CM

## 2024-02-18 DIAGNOSIS — R1310 Dysphagia, unspecified: Secondary | ICD-10-CM | POA: Diagnosis not present

## 2024-02-18 DIAGNOSIS — R1319 Other dysphagia: Secondary | ICD-10-CM

## 2024-02-18 MED ORDER — NA SULFATE-K SULFATE-MG SULF 17.5-3.13-1.6 GM/177ML PO SOLN: 1.0000 | Freq: Once | ORAL | 0 refills | Status: AC

## 2024-02-18 NOTE — Progress Notes (Unsigned)
 HPI :    Used to have heartburn; hasn't been a problem recently; was on omeprazole ; off of it for 5 years. Weight stable  Sister was diagnosed with colon cancer in her 64s.     Colonoscopy Aug 2020 (Dr. Teressa)   Indication: History of polyps, family history of colon cancer (sister) - Three 2 to 3 mm polyps in the descending colon, removed with a cold snare. Resected and retrieved.  - The examination was otherwise normal on direct and retroflexion views.  Rec repeat 5 years  Past Medical History:  Diagnosis Date   Allergy    Arthritis    has an autoimmune disease   Cataract    Bil surgery   GERD (gastroesophageal reflux disease)    Insomnia    Kidney stones 03/2013   sees Dr. Prentice Sizer with [facility]    Personal history presenting hazards to health 01/28/2007   Qualifier: Diagnosis of   By: Delos LATHER, Cindy      IMO SNOMED Dx Update Oct 2024     Varicose veins of legs      Past Surgical History:  Procedure Laterality Date   CATARACT EXTRACTION     Bil   cervix laser surgery     COLONOSCOPY  12/16/2018   per Dr. Rolan Teressa, adenomatous polyps, repeat in 5 yrs    KIDNEY STONE SURGERY     had 2 stents put in   LITHOTRIPSY  2014   POLYPECTOMY     TONSILLECTOMY     varicose veins     Dr. Doreene   Family History  Problem Relation Age of Onset   Cancer Other        skin, bladder, & breast   Arthritis Other    Diabetes Other    Hyperlipidemia Other    Hypertension Other    Kidney disease Other    Bladder Cancer Father    Melanoma Father    Diabetes Father    Melanoma Paternal Aunt    Heart disease Mother    Hypertension Mother    Colon cancer Sister    Emphysema Brother    Colon cancer Cousin    Social History   Tobacco Use   Smoking status: Never   Smokeless tobacco: Never  Substance Use Topics   Alcohol use: No    Alcohol/week: 0.0 standard drinks of alcohol   Drug use: No   Current Outpatient Medications  Medication Sig Dispense  Refill   CITRUS BERGAMOT PO Take by mouth daily at 2 PM.     cyanocobalamin  (VITAMIN B12) 1000 MCG tablet Take 1,000 mcg by mouth daily.     desloratadine  (CLARINEX ) 5 MG tablet TAKE 1 TABLET(5 MG) BY MOUTH DAILY 90 tablet 3   Omega-3 Fatty Acids (FISH OIL) 1200 MG CAPS Take by mouth daily.     Tamsulosin HCl (FLOMAX PO) Take by mouth daily. OTC-[facility]- [facility]     Current Facility-Administered Medications  Medication Dose Route Frequency Provider Last Rate Last Admin   triamcinolone  acetonide (KENALOG ) 10 MG/ML injection 10 mg  10 mg Other Once Gershon Donnice SAUNDERS, DPM       Allergies  Allergen Reactions   Etodolac     Made stomach bleed   Diclofenac  Sodium Other (See Comments)    Pt unsure of reaction   Penicillins     REACTION: Hives   Sulfonamide Derivatives     REACTION: Nausea/vomiting   Tetracyclines & Related     Syncope  Review of Systems: All systems reviewed and negative except where noted in HPI.    No results found.  Physical Exam: There were no vitals taken for this visit. Constitutional: Pleasant,well-developed, ***female in no acute distress. HEENT: Normocephalic and atraumatic. Conjunctivae are normal. No scleral icterus. Neck supple.  Cardiovascular: Normal rate, regular rhythm.  Pulmonary/chest: Effort normal and breath sounds normal. No wheezing, rales or rhonchi. Abdominal: Soft, nondistended, nontender. Bowel sounds active throughout. There are no masses palpable. No hepatomegaly. Extremities: no edema Lymphadenopathy: No cervical adenopathy noted. Neurological: Alert and oriented to person place and time. Skin: Skin is warm and dry. No rashes noted. Psychiatric: Normal mood and affect. Behavior is normal.  CBC    Component Value Date/Time   WBC 5.7 05/18/2023 0930   RBC 4.65 05/18/2023 0930   HGB 14.2 05/18/2023 0930   HCT 41.9 05/18/2023 0930   PLT 223.0 05/18/2023 0930   MCV 90.0 05/18/2023 0930   MCHC 33.8 05/18/2023 0930   RDW 13.2  05/18/2023 0930   LYMPHSABS 2.2 05/18/2023 0930   MONOABS 0.4 05/18/2023 0930   EOSABS 0.0 05/18/2023 0930   BASOSABS 0.0 05/18/2023 0930    CMP     Component Value Date/Time   NA 142 05/18/2023 0930   K 4.1 05/18/2023 0930   CL 107 05/18/2023 0930   CO2 26 05/18/2023 0930   GLUCOSE 94 05/18/2023 0930   BUN 16 05/18/2023 0930   CREATININE 0.66 05/18/2023 0930   CALCIUM 9.5 05/18/2023 0930   PROT 7.1 05/18/2023 0930   ALBUMIN 4.6 05/18/2023 0930   AST 17 05/18/2023 0930   ALT 12 05/18/2023 0930   ALKPHOS 72 05/18/2023 0930   BILITOT 0.4 05/18/2023 0930   GFRNONAA 81 08/20/2007 1042   GFRAA 98 08/20/2007 1042       Latest Ref Rng & Units 05/18/2023    9:30 AM 12/05/2022   10:55 AM 12/23/2021   11:07 AM  CBC EXTENDED  WBC 4.0 - 10.5 K/uL 5.7  7.2  7.0   RBC 3.87 - 5.11 Mil/uL 4.65  4.66  4.54   Hemoglobin 12.0 - 15.0 g/dL 85.7  85.9  86.0   HCT 36.0 - 46.0 % 41.9  42.2  40.1   Platelets 150.0 - 400.0 K/uL 223.0  227.0  268.0   NEUT# 1.4 - 7.7 K/uL 3.0  4.1  4.4   Lymph# 0.7 - 4.0 K/uL 2.2  2.4  1.9       ASSESSMENT AND PLAN:  Johnny Garnette LABOR, MD

## 2024-02-18 NOTE — Patient Instructions (Signed)
 You have been scheduled for an endoscopy and colonoscopy. Please follow the written instructions given to you at your visit today.  If you use inhalers (even only as needed), please bring them with you on the day of your procedure.  DO NOT TAKE 7 DAYS PRIOR TO TEST- Trulicity (dulaglutide) Ozempic, Wegovy (semaglutide) Mounjaro (tirzepatide) Bydureon Bcise (exanatide extended release)  DO NOT TAKE 1 DAY PRIOR TO YOUR TEST Rybelsus (semaglutide) Adlyxin (lixisenatide) Victoza (liraglutide) Byetta (exanatide) ___________________________________________________________________________  Thank you for entrusting me with your care and for choosing [facility],  Dr. Glendia Holt  _______________________________________________________  If your blood pressure at your visit was 140/90 or greater, please contact your primary care physician to follow up on this.  _______________________________________________________  If you are age 60 or older, your body mass index should be between 23-30. Your Body mass index is 23.08 kg/m. If this is out of the aforementioned range listed, please consider follow up with your Primary Care Provider.  If you are age 55 or younger, your body mass index should be between 19-25. Your Body mass index is 23.08 kg/m. If this is out of the aformentioned range listed, please consider follow up with your Primary Care Provider.   ________________________________________________________  The [facility] providers would like to encourage you to use MYCHART to communicate with providers for non-urgent requests or questions.  Due to long hold times on the telephone, sending your provider a message by [facility] may be a faster and more efficient way to get a response.  Please allow 48 business hours for a response.  Please remember that this is for non-urgent requests.  _______________________________________________________  [facility] is using a  team-based approach to care.  Your team is made up of your doctor and two to three APPS. Our APPS (Nurse Practitioners and Physician Assistants) work with your physician to ensure care continuity for you. They are fully qualified to address your health concerns and develop a treatment plan. They communicate directly with your gastroenterologist to care for you. Seeing the Advanced Practice Practitioners on your physician's team can help you by facilitating care more promptly, often allowing for earlier appointments, access to diagnostic testing, procedures, and other specialty referrals.

## 2024-03-05 ENCOUNTER — Encounter: Payer: Self-pay | Admitting: Gastroenterology

## 2024-03-07 DIAGNOSIS — H40053 Ocular hypertension, bilateral: Secondary | ICD-10-CM | POA: Diagnosis not present

## 2024-03-12 ENCOUNTER — Encounter: Payer: Self-pay | Admitting: Gastroenterology

## 2024-03-12 ENCOUNTER — Ambulatory Visit (AMBULATORY_SURGERY_CENTER): Admitting: Gastroenterology

## 2024-03-12 VITALS — BP 109/64 | HR 61 | Temp 98.4°F | Resp 17 | Ht 66.0 in | Wt 143.0 lb

## 2024-03-12 DIAGNOSIS — K562 Volvulus: Secondary | ICD-10-CM | POA: Diagnosis not present

## 2024-03-12 DIAGNOSIS — Z860101 Personal history of adenomatous and serrated colon polyps: Secondary | ICD-10-CM

## 2024-03-12 DIAGNOSIS — Q438 Other specified congenital malformations of intestine: Secondary | ICD-10-CM | POA: Diagnosis not present

## 2024-03-12 DIAGNOSIS — K449 Diaphragmatic hernia without obstruction or gangrene: Secondary | ICD-10-CM

## 2024-03-12 DIAGNOSIS — K2289 Other specified disease of esophagus: Secondary | ICD-10-CM | POA: Diagnosis not present

## 2024-03-12 DIAGNOSIS — D122 Benign neoplasm of ascending colon: Secondary | ICD-10-CM | POA: Diagnosis not present

## 2024-03-12 DIAGNOSIS — R1310 Dysphagia, unspecified: Secondary | ICD-10-CM

## 2024-03-12 DIAGNOSIS — D123 Benign neoplasm of transverse colon: Secondary | ICD-10-CM | POA: Diagnosis not present

## 2024-03-12 DIAGNOSIS — R1319 Other dysphagia: Secondary | ICD-10-CM

## 2024-03-12 DIAGNOSIS — D124 Benign neoplasm of descending colon: Secondary | ICD-10-CM | POA: Diagnosis not present

## 2024-03-12 DIAGNOSIS — Z1211 Encounter for screening for malignant neoplasm of colon: Secondary | ICD-10-CM

## 2024-03-12 DIAGNOSIS — Z860100 Personal history of colon polyps, unspecified: Secondary | ICD-10-CM

## 2024-03-12 DIAGNOSIS — K635 Polyp of colon: Secondary | ICD-10-CM

## 2024-03-12 DIAGNOSIS — D125 Benign neoplasm of sigmoid colon: Secondary | ICD-10-CM | POA: Diagnosis not present

## 2024-03-12 DIAGNOSIS — K219 Gastro-esophageal reflux disease without esophagitis: Secondary | ICD-10-CM | POA: Diagnosis not present

## 2024-03-12 MED ORDER — SODIUM CHLORIDE 0.9 % IV SOLN: 500.0000 mL | INTRAVENOUS | Status: DC

## 2024-03-12 NOTE — Op Note (Addendum)
 [name] Procedure Date: 03/12/2024 2:57 PM MRN: [id number] Endoscopist: Glendia E. Stacia , MD, [id number] Age: 68 Referring MD:  Date of Birth: July 14, 1955 Gender: Female Account #: [id number] Procedure:                Upper GI endoscopy Indications:              Dysphagia Medicines:                Monitored Anesthesia Care Procedure:                Pre-Anesthesia Assessment:                           - Prior to the procedure, a History and Physical                            was performed, and patient medications and                            allergies were reviewed. The patient's tolerance of                            previous anesthesia was also reviewed. The risks                            and benefits of the procedure and the sedation                            options and risks were discussed with the patient.                            All questions were answered, and informed consent                            was obtained. Prior Anticoagulants: The patient has                            taken no anticoagulant or antiplatelet agents. ASA                            Grade Assessment: II - A patient with mild systemic                            disease. After reviewing the risks and benefits,                            the patient was deemed in satisfactory condition to                            undergo the procedure.                           After obtaining informed consent, the endoscope was  passed under direct vision. Throughout the                            procedure, the patient's blood pressure, pulse, and                            oxygen saturations were monitored continuously. The                            Olympus scope [id number] was introduced through the                            mouth, and advanced to the second part of duodenum.                            The upper GI endoscopy was accomplished  without                            difficulty. The patient tolerated the procedure                            well. Scope In: Scope Out: Findings:                 The examined portions of the nasopharynx,                            oropharynx and larynx were normal.                           The examined esophagus was normal. Biopsies were                            obtained from the proximal and distal esophagus                            with cold forceps for histology of suspected                            eosinophilic esophagitis. Estimated blood loss was                            minimal. A guidewire was placed under fluoroscopic                            guidance and the scope was withdrawn. Dilation was                            performed with a Savary dilator with no resistance                            at 17 mm. The dilation site was examined following                            endoscope  reinsertion and showed no change.                            Estimated blood loss: none.                           A 5 cm hiatal hernia was present.                           The exam of the stomach was otherwise normal.                           The examined duodenum was normal. Complications:            No immediate complications. Estimated Blood Loss:     Estimated blood loss was minimal. Impression:               - The examined portions of the nasopharynx,                            oropharynx and larynx were normal.                           - Normal esophagus. No obvious stricture/stenosis.                            Dilated.                           - 5 cm hiatal hernia.                           - Normal examined duodenum.                           - Biopsies were taken with a cold forceps for                            evaluation of eosinophilic esophagitis. Recommendation:           - Patient has a contact number available for                            emergencies. The signs  and symptoms of potential                            delayed complications were discussed with the                            patient. Return to normal activities tomorrow.                            Written discharge instructions were provided to the                            patient.                           -  Resume previous diet.                           - Continue present medications.                           - Await pathology results. Roan Sawchuk E. Stacia, MD 03/12/2024 3:50:34 PM This report has been signed electronically. Addendum Number: 1   Addendum Date: 04/01/2024 6:11:48 AM      The procedure report errantly indicates that dilation was performed       under fluoroscopy. No fluoroscopy was used during the procedure. The       Savary dilation was performed over guidewire without the use of       fluoroscopy Zimri Brennen E. Stacia, MD 04/01/2024 6:13:18 AM This report has been signed electronically.

## 2024-03-12 NOTE — Op Note (Signed)
 [facility] Patient Name: Tracey Sweeney Procedure Date: 03/12/2024 2:53 PM MRN: [id number] Endoscopist: Glendia E. Stacia , MD, [id number] Age: 68 Referring MD:  Date of Birth: August 14, 1955 Gender: Female Account #: [id number] Procedure:                Colonoscopy Indications:              High risk colon cancer surveillance: Personal                            history of adenoma less than 10 mm in size Medicines:                Monitored Anesthesia Care Procedure:                Pre-Anesthesia Assessment:                           - Prior to the procedure, a History and Physical                            was performed, and patient medications and                            allergies were reviewed. The patient's tolerance of                            previous anesthesia was also reviewed. The risks                            and benefits of the procedure and the sedation                            options and risks were discussed with the patient.                            All questions were answered, and informed consent                            was obtained. Prior Anticoagulants: The patient has                            taken no anticoagulant or antiplatelet agents. ASA                            Grade Assessment: II - A patient with mild systemic                            disease. After reviewing the risks and benefits,                            the patient was deemed in satisfactory condition to                            undergo the procedure.  After obtaining informed consent, the colonoscope                            was passed under direct vision. Throughout the                            procedure, the patient's blood pressure, pulse, and                            oxygen saturations were monitored continuously. The                            Olympus Scope [id number] was introduced through the                            anus and  advanced to the the cecum, identified by                            appendiceal orifice and ileocecal valve. The                            colonoscopy was somewhat difficult due to a                            redundant colon and significant looping. Successful                            completion of the procedure was aided by using                            manual pressure and withdrawing and reinserting the                            scope. The patient tolerated the procedure well.                            The quality of the bowel preparation was excellent.                            The ileocecal valve, appendiceal orifice, and                            rectum were photographed. The bowel preparation                            used was SUPREP via split dose instruction. Scope In: 3:15:37 PM Scope Out: 3:43:02 PM Scope Withdrawal Time: 0 hours 10 minutes 42 seconds  Total Procedure Duration: 0 hours 27 minutes 25 seconds  Findings:                 The perianal and digital rectal examinations were                            normal. Pertinent negatives include normal  sphincter tone and no palpable rectal lesions.                           A 5 mm polyp was found in the descending colon. The                            polyp was sessile. The polyp was removed with a                            cold snare. Resection and retrieval were complete.                            Estimated blood loss was minimal.                           A 2 mm polyp was found in the ascending colon. The                            polyp was sessile. The polyp was removed with a                            cold snare. Resection and retrieval were complete.                            Estimated blood loss was minimal.                           A 4 mm polyp was found in the transverse colon. The                            polyp was sessile. The polyp was removed with a                             cold snare. Resection and retrieval were complete.                            Estimated blood loss was minimal.                           An 8 mm polyp was found in the sigmoid colon. The                            polyp was sessile. The polyp was removed with a                            cold snare. Resection and retrieval were complete.                            Estimated blood loss was minimal.                           The exam was otherwise normal throughout the  examined colon.                           The retroflexed view of the distal rectum and anal                            verge was normal and showed no anal or rectal                            abnormalities. Complications:            No immediate complications. Estimated Blood Loss:     Estimated blood loss was minimal. Impression:               - One 5 mm polyp in the descending colon, removed                            with a cold snare. Resected and retrieved.                           - One 2 mm polyp in the ascending colon, removed                            with a cold snare. Resected and retrieved.                           - One 4 mm polyp in the transverse colon, removed                            with a cold snare. Resected and retrieved.                           - One 8 mm polyp in the sigmoid colon, removed with                            a cold snare. Resected and retrieved.                           - The distal rectum and anal verge are normal on                            retroflexion view. Recommendation:           - Patient has a contact number available for                            emergencies. The signs and symptoms of potential                            delayed complications were discussed with the                            patient. Return to normal activities tomorrow.  Written discharge instructions were provided to the                             patient.                           - Resume previous diet.                           - Continue present medications.                           - Await pathology results.                           - Repeat colonoscopy (date not yet determined) for                            surveillance based on pathology results. Rizwan Kuyper E. Stacia, MD 03/12/2024 3:56:43 PM This report has been signed electronically.

## 2024-03-12 NOTE — Progress Notes (Signed)
 Pt's states no medical or surgical changes since previsit or office visit.

## 2024-03-12 NOTE — Progress Notes (Signed)
 Report to PACU, RN, vss, BBS= Clear.

## 2024-03-12 NOTE — Progress Notes (Signed)
 History and Physical Interval Note:  03/12/2024 2:54 PM  Tracey Sweeney  has presented today for endoscopic procedure(s), with the diagnosis of  Encounter Diagnoses  Name Primary?   Esophageal dysphagia Yes   History of colonic polyps   .  The various methods of evaluation and treatment have been discussed with the patient and/or family. After consideration of risks, benefits and other options for treatment, the patient has consented to  the endoscopic procedure(s).   The patient's history has been reviewed, patient examined, no change in status, stable for endoscopic procedure(s).  I have reviewed the patient's chart and labs.  Questions were answered to the patient's satisfaction.     Gaia Gullikson E. Stacia, MD [facility]

## 2024-03-12 NOTE — Patient Instructions (Signed)
 Please read handouts provided. Continue present medications. Resume previous diet. Await pathology results.   YOU HAD AN ENDOSCOPIC PROCEDURE TODAY AT THE [facility]:   Refer to the procedure report that was given to you for any specific questions about what was found during the examination.  If the procedure report does not answer your questions, please call your gastroenterologist to clarify.  If you requested that your care partner not be given the details of your procedure findings, then the procedure report has been included in a sealed envelope for you to review at your convenience later.  YOU SHOULD EXPECT: Some feelings of bloating in the abdomen. Passage of more gas than usual.  Walking can help get rid of the air that was put into your GI tract during the procedure and reduce the bloating. If you had a lower endoscopy (such as a colonoscopy or flexible sigmoidoscopy) you may notice spotting of blood in your stool or on the toilet paper. If you underwent a bowel prep for your procedure, you may not have a normal bowel movement for a few days.  Please Note:  You might notice some irritation and congestion in your nose or some drainage.  This is from the oxygen used during your procedure.  There is no need for concern and it should clear up in a day or so.  SYMPTOMS TO REPORT IMMEDIATELY:  Following lower endoscopy (colonoscopy or flexible sigmoidoscopy):  Excessive amounts of blood in the stool  Significant tenderness or worsening of abdominal pains  Swelling of the abdomen that is new, acute  Fever of 100F or higher  Following upper endoscopy (EGD)  Vomiting of blood or coffee ground material  New chest pain or pain under the shoulder blades  Painful or persistently difficult swallowing  New shortness of breath  Fever of 100F or higher  Black, tarry-looking stools  For urgent or emergent issues, a gastroenterologist can be reached at any hour by calling [phone]  [phone]. Do not use MyChart messaging for urgent concerns.    DIET:  We do recommend a small meal at first, but then you may proceed to your regular diet.  Drink plenty of fluids but you should avoid alcoholic beverages for 24 hours.  ACTIVITY:  You should plan to take it easy for the rest of today and you should NOT DRIVE or use heavy machinery until tomorrow (because of the sedation medicines used during the test).    FOLLOW UP: Our staff will call the number listed on your records the next business day following your procedure.  We will call around 7:15- 8:00 am to check on you and address any questions or concerns that you may have regarding the information given to you following your procedure. If we do not reach you, we will leave a message.     If any biopsies were taken you will be contacted by phone or by letter within the next 1-3 weeks.  Please call us  at [phone] if you have not heard about the biopsies in 3 weeks.    SIGNATURES/CONFIDENTIALITY: You and/or your care partner have signed paperwork which will be entered into your electronic medical record.  These signatures attest to the fact that that the information above on your After Visit Summary has been reviewed and is understood.  Full responsibility of the confidentiality of this discharge information lies with you and/or your care-partner.

## 2024-03-13 ENCOUNTER — Telehealth: Payer: Self-pay

## 2024-03-13 NOTE — Telephone Encounter (Signed)
  Follow up Call-     03/12/2024    2:32 PM  Call back number  Post procedure Call Back phone  # [phone]  Permission to leave phone message Yes     Patient questions:  Do you have a fever, pain , or abdominal swelling? No. Pain Score  0 *  Have you tolerated food without any problems? Yes.    Have you been able to return to your normal activities? Yes.    Do you have any questions about your discharge instructions: Diet   No. Medications  No. Follow up visit  No.  Do you have questions or concerns about your Care? No.  Actions: * If pain score is 4 or above: No action needed, pain <4.

## 2024-03-18 LAB — SURGICAL PATHOLOGY

## 2024-03-20 ENCOUNTER — Ambulatory Visit: Payer: Self-pay | Admitting: Gastroenterology

## 2024-03-20 NOTE — Progress Notes (Signed)
 Tracey Sweeney,  The biopsies of your esophagus showed mild features suggestive of acid reflux, but no evidence of eosinophilic esophagitis to explain your swallowing difficulties.  Sometimes acid reflux can cause symptoms of difficulty swallowing If you continue to have significant swallowing problems, I would recommend a trial of a proton pump inhibitor to reduce stomach acid. Please let us  know if you would like us  to prescribe you a medication to reduce acid reflux.  Two of the four polyps which I removed from your colon were proven to be completely benign but are considered pre-cancerous polyps that MAY have grown into cancer if they had not been removed.  The other two polyps were not precancerous.  Studies shows that at least 20% of women over age 66 and 30% of men over age 73 have pre-cancerous polyps.  Based on current nationally recognized surveillance guidelines, I recommend that you have a repeat colonoscopy in 7 years.

## 2024-04-04 DIAGNOSIS — H40053 Ocular hypertension, bilateral: Secondary | ICD-10-CM | POA: Diagnosis not present

## 2024-05-15 ENCOUNTER — Ambulatory Visit: Payer: Medicare HMO | Admitting: Adult Health

## 2025-01-26 ENCOUNTER — Ambulatory Visit
